# Patient Record
Sex: Female | Born: 1960
Health system: Southern US, Community
[De-identification: ages and names within clinical notes are randomized; demographics above are authoritative.]

## PROBLEM LIST (undated history)

## (undated) DIAGNOSIS — F419 Anxiety disorder, unspecified: Secondary | ICD-10-CM

## (undated) DIAGNOSIS — Z9889 Other specified postprocedural states: Secondary | ICD-10-CM

## (undated) DIAGNOSIS — K649 Unspecified hemorrhoids: Secondary | ICD-10-CM

## (undated) DIAGNOSIS — R51 Headache: Secondary | ICD-10-CM

## (undated) DIAGNOSIS — C541 Malignant neoplasm of endometrium: Secondary | ICD-10-CM

## (undated) DIAGNOSIS — R7303 Prediabetes: Secondary | ICD-10-CM

## (undated) DIAGNOSIS — I10 Essential (primary) hypertension: Secondary | ICD-10-CM

## (undated) DIAGNOSIS — R112 Nausea with vomiting, unspecified: Secondary | ICD-10-CM

## (undated) DIAGNOSIS — R519 Headache, unspecified: Secondary | ICD-10-CM

## (undated) DIAGNOSIS — T7840XA Allergy, unspecified, initial encounter: Secondary | ICD-10-CM

## (undated) DIAGNOSIS — Z87442 Personal history of urinary calculi: Secondary | ICD-10-CM

## (undated) HISTORY — DX: Unspecified hemorrhoids: K64.9

## (undated) HISTORY — PX: OTHER SURGICAL HISTORY: SHX169

## (undated) HISTORY — DX: Nausea with vomiting, unspecified: Z98.890

## (undated) HISTORY — PX: WISDOM TOOTH EXTRACTION: SHX21

## (undated) HISTORY — DX: Malignant neoplasm of endometrium: C54.1

## (undated) HISTORY — PX: APPENDECTOMY: SHX54

## (undated) HISTORY — DX: Allergy, unspecified, initial encounter: T78.40XA

## (undated) HISTORY — DX: Other specified postprocedural states: R11.2

## (undated) HISTORY — PX: COLONOSCOPY: SHX174

## (undated) HISTORY — DX: Essential (primary) hypertension: I10

---

## 2016-12-21 DIAGNOSIS — E8881 Metabolic syndrome: Secondary | ICD-10-CM | POA: Diagnosis not present

## 2016-12-21 DIAGNOSIS — E668 Other obesity: Secondary | ICD-10-CM | POA: Diagnosis not present

## 2016-12-21 DIAGNOSIS — F331 Major depressive disorder, recurrent, moderate: Secondary | ICD-10-CM | POA: Diagnosis not present

## 2016-12-21 DIAGNOSIS — I10 Essential (primary) hypertension: Secondary | ICD-10-CM | POA: Diagnosis not present

## 2017-03-22 DIAGNOSIS — I10 Essential (primary) hypertension: Secondary | ICD-10-CM | POA: Diagnosis not present

## 2017-03-22 DIAGNOSIS — E782 Mixed hyperlipidemia: Secondary | ICD-10-CM | POA: Diagnosis not present

## 2017-03-22 DIAGNOSIS — F331 Major depressive disorder, recurrent, moderate: Secondary | ICD-10-CM | POA: Diagnosis not present

## 2017-03-22 DIAGNOSIS — E8881 Metabolic syndrome: Secondary | ICD-10-CM | POA: Diagnosis not present

## 2017-03-29 DIAGNOSIS — E668 Other obesity: Secondary | ICD-10-CM | POA: Diagnosis not present

## 2017-03-29 DIAGNOSIS — I10 Essential (primary) hypertension: Secondary | ICD-10-CM | POA: Diagnosis not present

## 2017-03-29 DIAGNOSIS — F331 Major depressive disorder, recurrent, moderate: Secondary | ICD-10-CM | POA: Diagnosis not present

## 2017-03-29 DIAGNOSIS — E8881 Metabolic syndrome: Secondary | ICD-10-CM | POA: Diagnosis not present

## 2017-07-29 DIAGNOSIS — F331 Major depressive disorder, recurrent, moderate: Secondary | ICD-10-CM | POA: Diagnosis not present

## 2017-07-29 DIAGNOSIS — E668 Other obesity: Secondary | ICD-10-CM | POA: Diagnosis not present

## 2017-07-29 DIAGNOSIS — E8881 Metabolic syndrome: Secondary | ICD-10-CM | POA: Diagnosis not present

## 2017-07-29 DIAGNOSIS — I10 Essential (primary) hypertension: Secondary | ICD-10-CM | POA: Diagnosis not present

## 2017-08-04 DIAGNOSIS — H5213 Myopia, bilateral: Secondary | ICD-10-CM | POA: Diagnosis not present

## 2018-01-18 DIAGNOSIS — R7301 Impaired fasting glucose: Secondary | ICD-10-CM | POA: Diagnosis not present

## 2018-01-18 DIAGNOSIS — E8881 Metabolic syndrome: Secondary | ICD-10-CM | POA: Diagnosis not present

## 2018-01-18 DIAGNOSIS — E782 Mixed hyperlipidemia: Secondary | ICD-10-CM | POA: Diagnosis not present

## 2018-01-18 DIAGNOSIS — I10 Essential (primary) hypertension: Secondary | ICD-10-CM | POA: Diagnosis not present

## 2018-01-25 DIAGNOSIS — E8881 Metabolic syndrome: Secondary | ICD-10-CM | POA: Diagnosis not present

## 2018-01-25 DIAGNOSIS — E668 Other obesity: Secondary | ICD-10-CM | POA: Diagnosis not present

## 2018-01-25 DIAGNOSIS — I10 Essential (primary) hypertension: Secondary | ICD-10-CM | POA: Diagnosis not present

## 2018-02-06 DIAGNOSIS — Z1231 Encounter for screening mammogram for malignant neoplasm of breast: Secondary | ICD-10-CM | POA: Diagnosis not present

## 2018-02-23 DIAGNOSIS — R9389 Abnormal findings on diagnostic imaging of other specified body structures: Secondary | ICD-10-CM | POA: Diagnosis not present

## 2018-02-23 DIAGNOSIS — N939 Abnormal uterine and vaginal bleeding, unspecified: Secondary | ICD-10-CM | POA: Diagnosis not present

## 2018-03-14 ENCOUNTER — Encounter: Payer: Self-pay | Admitting: *Deleted

## 2018-03-14 DIAGNOSIS — N938 Other specified abnormal uterine and vaginal bleeding: Secondary | ICD-10-CM | POA: Diagnosis not present

## 2018-03-14 DIAGNOSIS — R9389 Abnormal findings on diagnostic imaging of other specified body structures: Secondary | ICD-10-CM | POA: Diagnosis not present

## 2018-03-14 DIAGNOSIS — N8502 Endometrial intraepithelial neoplasia [EIN]: Secondary | ICD-10-CM | POA: Diagnosis not present

## 2018-03-14 DIAGNOSIS — Z6837 Body mass index (BMI) 37.0-37.9, adult: Secondary | ICD-10-CM | POA: Diagnosis not present

## 2018-03-20 HISTORY — PX: ABDOMINAL HYSTERECTOMY: SHX81

## 2018-03-22 DIAGNOSIS — Z6837 Body mass index (BMI) 37.0-37.9, adult: Secondary | ICD-10-CM | POA: Diagnosis not present

## 2018-03-22 DIAGNOSIS — N8502 Endometrial intraepithelial neoplasia [EIN]: Secondary | ICD-10-CM | POA: Diagnosis not present

## 2018-04-05 DIAGNOSIS — N8502 Endometrial intraepithelial neoplasia [EIN]: Secondary | ICD-10-CM | POA: Diagnosis not present

## 2018-04-05 DIAGNOSIS — N840 Polyp of corpus uteri: Secondary | ICD-10-CM | POA: Diagnosis not present

## 2018-04-05 DIAGNOSIS — N800 Endometriosis of uterus: Secondary | ICD-10-CM | POA: Diagnosis not present

## 2018-04-05 DIAGNOSIS — Z91048 Other nonmedicinal substance allergy status: Secondary | ICD-10-CM | POA: Diagnosis not present

## 2018-04-05 DIAGNOSIS — C541 Malignant neoplasm of endometrium: Secondary | ICD-10-CM | POA: Diagnosis not present

## 2018-04-05 DIAGNOSIS — D62 Acute posthemorrhagic anemia: Secondary | ICD-10-CM | POA: Diagnosis not present

## 2018-04-19 ENCOUNTER — Telehealth: Payer: Self-pay | Admitting: *Deleted

## 2018-04-19 NOTE — Telephone Encounter (Signed)
Called and spoke with the patient, gave appt date/time of May 7th at 1:30pm. Gave the patient information for valet, pelvic exam and address.

## 2018-04-25 ENCOUNTER — Encounter: Payer: Self-pay | Admitting: Gynecologic Oncology

## 2018-04-25 ENCOUNTER — Encounter: Payer: Self-pay | Admitting: Obstetrics

## 2018-04-25 ENCOUNTER — Inpatient Hospital Stay: Payer: BLUE CROSS/BLUE SHIELD | Attending: Obstetrics | Admitting: Obstetrics

## 2018-04-25 VITALS — BP 142/80 | HR 96 | Temp 98.9°F | Resp 20 | Ht 65.0 in | Wt 224.6 lb

## 2018-04-25 DIAGNOSIS — C541 Malignant neoplasm of endometrium: Secondary | ICD-10-CM | POA: Diagnosis not present

## 2018-04-25 DIAGNOSIS — Z90722 Acquired absence of ovaries, bilateral: Secondary | ICD-10-CM

## 2018-04-25 DIAGNOSIS — Z90710 Acquired absence of both cervix and uterus: Secondary | ICD-10-CM | POA: Diagnosis not present

## 2018-04-25 NOTE — Patient Instructions (Signed)
1)Plan to have an ultrasound at the end of June with an appointment to meet with Dr. Gerarda Fraction early July.  2)We will also order testing on your tumor removed at the time of surgery to see if there may be a genetic component that contributed to the development of this cancer.  If this returns positive, we will refer you to genetics.   3) Please call for any questions or concerns at [phone].

## 2018-04-25 NOTE — Progress Notes (Signed)
Consult Note: Gyn-Onc  Consult was requested by Dr. Gari Crown  CC:  Chief Complaint  Patient presents with  . Endometrial adenocarcinoma [facility])    HPI: Ms. Eileen Rodriguez  is a very nice 57 y.o. P4  The patient had gone almost 1 year with no menses and then in August 2018 she had 2 months of bleeding again.  This was mentioned to her primary care provider and an ultrasound was ordered.  This revealed an endometrial thickness of 20 mm and a 10 mm polypoid lesion.  She was then referred on to Dr. Evie Lacks and on 03/14/2018 and endometrial biopsy revealed complex hyperplasia with atypia.  The patient desired to proceed with definitive hysterectomy and was taken the operating room 04/05/2018 (~ 3 weeks prior to today) where a total abdominal hysterectomy was performed at [facility] in [facility].  Unfortunately the final pathology on the uterus revealed an endometrial cancer with pathology as follows: Grade 1 Endometrioid Endometrial Adenocarcinoma No myometrial invasion was seen. No lymph-vascular invasion was seen. No cervical stromal or serosal involvement.  Her ovaries and tubes remain in-situ. We do not have a copy of a CXR yet.  She is therefore at least a Stage IA, Grade 1 EM Uterine CA.  She has no current complaints relating to the diagnosis. She wants to hold off on any further surgery for now due to the upcoming wedding of her daughter, who does not know her diagnosis.  Measurement of disease:  . TBD   Radiology: . 02/23/2018-TA/TVUS-[facility] rocking him-uterus 9.6 x 6.1 x 7 with a 20 mm endometrial stripe normal adnexa bilaterally with a left ovary noted to have 1 cm cyst likely dominant follicle. Marland Kitchen Pending follow-up on a chest x-ray    Oncologic History:     No history exists.    Current Meds:  No outpatient encounter medications on file as of 04/25/2018.   No facility-administered encounter medications on file as of 04/25/2018.     Allergy:  Allergies   Allergen Reactions  . Tape Rash    Bandaids  She has had Dermabond application without reaction  Social Hx:   Social History   Socioeconomic History  . Marital status: Married    Spouse name: Not on file  . Number of children: Not on file  . Years of education: Not on file  . Highest education level: Not on file  Occupational History  . Not on file  Social Needs  . Financial resource strain: Not on file  . Food insecurity:    Worry: Not on file    Inability: Not on file  . Transportation needs:    Medical: Not on file    Non-medical: Not on file  Tobacco Use  . Smoking status: Never Smoker  . Smokeless tobacco: Never Used  Substance and Sexual Activity  . Alcohol use: Never    Frequency: Never  . Drug use: Never  . Sexual activity: Yes  Lifestyle  . Physical activity:    Days per week: Not on file    Minutes per session: Not on file  . Stress: Not on file  Relationships  . Social connections:    Talks on phone: Not on file    Gets together: Not on file    Attends religious service: Not on file    Active member of club or organization: Not on file    Attends meetings of clubs or organizations: Not on file    Relationship status: Not on  file  . Intimate partner violence:    Fear of current or ex partner: Not on file    Emotionally abused: Not on file    Physically abused: Not on file    Forced sexual activity: Not on file  Other Topics Concern  . Not on file  Social History Narrative  . Not on file    Past Surgical Hx:  Past Surgical History:  Procedure Laterality Date  . ABDOMINAL HYSTERECTOMY  03/2018  . APPENDECTOMY     6 months pregnant  . WISDOM TOOTH EXTRACTION      Past Medical Hx:  Past Medical History:  Diagnosis Date  . Uterine polyp     Past Gynecological History:   GYNECOLOGIC HISTORY:  No LMP recorded. Menarche: 57 years old P 4 LMP 02/2018 Contraceptive OCPs less than 10 years HRT none  Last Pap March 2019  Family Hx:   Family History  Problem Relation Age of Onset  . Thyroid disease Mother 65       treated with radiation  . Breast cancer Maternal Grandmother 60       with metastases to the brain    Review of Systems:  Review of Systems  Constitutional: Negative.   HENT:  Negative.   Eyes: Negative.   Respiratory: Negative.   Cardiovascular: Negative.   Gastrointestinal: Negative.   Endocrine: Negative.   Genitourinary: Negative.    Musculoskeletal: Negative.   Skin: Negative.   Neurological: Negative.   Hematological: Negative.   Psychiatric/Behavioral: Negative.     Vitals:  Blood pressure (!) 142/80, pulse 96, temperature 98.9 F (37.2 C), temperature source Oral, resp. rate 20, height '5\' 5"'$  (1.651 m), weight 224 lb 9.6 oz (101.9 kg), SpO2 100 %. Body mass index is 37.38 kg/m.   Physical Exam: ECOG PERFORMANCE STATUS: 0 - Asymptomatic   General :  Well developed, 57 y.o., female in no apparent distress HEENT:  Normocephalic/atraumatic, symmetric, EOMI, eyelids normal Neck:   Supple, no masses.  Lymphatics:  No cervical/ submandibular/ supraclavicular/ infraclavicular/ inguinal adenopathy Respiratory:  Respirations unlabored, no use of accessory muscles CV:   Deferred Breast:  Deferred Musculoskeletal: No CVA tenderness, normal muscle strength. Abdomen:  Transverse incision is healing, abdomen is soft, non-tender and nondistended. No evidence of hernia. No masses. Extremities:  No lymphedema, no erythema, non-tender. Skin:   Normal inspection Neuro/Psych:  No focal motor deficit, no abnormal mental status. Normal gait. Normal affect. Alert and oriented to person, place, and time  Genito Urinary: Deferred given recent surgery  Oncologic Summary: 1. At least Stage IA, Grade 1 endometrioid EM CA  Assessment/Plan: 1. Counseling for uterine cancer ? We discussed the diagnosis of uterine cancer and the typical presentation and prognosis ? We reviewed standard of care being  hysterectomy with BSO  ? Regarding lymphadenectomy; given this is not invading the myometrium and there is no LVSI along with a grade 1 tumor the risk of involvement of the lymph nodes is quite low and so I do not think proceeding with lymphadenectomy is necessary 2. Surgical management of the adnexa ? We did review again that standard of care surgical management of uterine cancer would include removal of the adnexa however since her preoperative diagnosis was not known to be malignant she has the ovaries in situ. ? There is some data on premenopausal women and conservation of the adnexa however I do not know of any strong data looking in the postmenopausal setting ? Likely the risk of finding malignancy in  the ovaries is quite low given the early stage of the disease however my recommendation is to consider return to surgery to perform a BSO. ? The patient's daughter has a wedding coming up in June and the patient would like to wait until after that time to proceed with any additional treatments  3. Pending items  ? Obtaining a chest x-ray ? MMR on outside hysterectomy specimen to see if risk of Lynch ? TVUS to evaluate adnexae once vaginal cuff healed. (about 6 weeks postop) ? Should she wish to conserve her ovaries I would recommend every 6 to 28-monthtransvaginal ultrasound along with her routine clinical exams 4. Her husband and her sister were present with the patient for the discussion with no further questions after review of the above 5. Plan to have her return after her transvaginal ultrasound to re-review the recommendation for BSO. 6. She will need a pelvic on return as she was only 3 weeks postop today and I did not want to disrupt the vaginal cuff healing.    SIsabel Caprice MD  04/26/2018, 3:39 PM  Cc: NGari Crown MD (Referring OB/GYN) PCP not to be copied until further notice.

## 2018-04-25 NOTE — Progress Notes (Signed)
MMR ordered on path from [facility], [id number], also known as [phone] case with [facility].  Spoke with Maudie Mercury at [employer].

## 2018-04-26 ENCOUNTER — Encounter: Payer: Self-pay | Admitting: Obstetrics

## 2018-04-26 DIAGNOSIS — C541 Malignant neoplasm of endometrium: Secondary | ICD-10-CM | POA: Insufficient documentation

## 2018-05-01 ENCOUNTER — Telehealth: Payer: Self-pay | Admitting: Gynecologic Oncology

## 2018-05-01 ENCOUNTER — Telehealth: Payer: Self-pay | Admitting: *Deleted

## 2018-05-01 DIAGNOSIS — C541 Malignant neoplasm of endometrium: Secondary | ICD-10-CM

## 2018-05-01 NOTE — Telephone Encounter (Signed)
Returned call to patient.  Patient stating she is calling to follow up on percentages that Dr. Gerarda Fraction was obtaining for her. Also advised we need to schedule for her to have a chest xray.  Patient advised that she would be contacted with information about setting up her xray.  No other concerns voiced.  Patient stating the information can be left on voicemail.

## 2018-05-01 NOTE — Telephone Encounter (Signed)
Called and spoke with the patient regarding her chest xray. Explained to the patient that at she can go to [facility] to the radiology department as a walk in appt for the xray

## 2018-05-24 ENCOUNTER — Ambulatory Visit (HOSPITAL_COMMUNITY)
Admission: RE | Admit: 2018-05-24 | Discharge: 2018-05-24 | Disposition: A | Discharge status: Home / Self Care | Payer: BLUE CROSS/BLUE SHIELD | Source: Ambulatory Visit | Attending: Gynecologic Oncology | Admitting: Gynecologic Oncology

## 2018-05-24 ENCOUNTER — Encounter (HOSPITAL_COMMUNITY): Payer: Self-pay | Admitting: Radiology

## 2018-05-24 DIAGNOSIS — C541 Malignant neoplasm of endometrium: Secondary | ICD-10-CM | POA: Insufficient documentation

## 2018-05-24 DIAGNOSIS — Z8542 Personal history of malignant neoplasm of other parts of uterus: Secondary | ICD-10-CM | POA: Diagnosis not present

## 2018-05-25 ENCOUNTER — Telehealth: Payer: Self-pay | Admitting: Gynecologic Oncology

## 2018-05-25 NOTE — Telephone Encounter (Signed)
Patient informed of chest xray results.  No concerns voiced.  Advised to call for any needs or concerns.

## 2018-06-13 ENCOUNTER — Ambulatory Visit (HOSPITAL_COMMUNITY)
Admission: RE | Admit: 2018-06-13 | Discharge: 2018-06-13 | Disposition: A | Discharge status: Home / Self Care | Payer: BLUE CROSS/BLUE SHIELD | Source: Ambulatory Visit | Attending: Obstetrics | Admitting: Obstetrics

## 2018-06-13 DIAGNOSIS — N838 Other noninflammatory disorders of ovary, fallopian tube and broad ligament: Secondary | ICD-10-CM | POA: Diagnosis not present

## 2018-06-13 DIAGNOSIS — N83202 Unspecified ovarian cyst, left side: Secondary | ICD-10-CM | POA: Diagnosis not present

## 2018-06-13 DIAGNOSIS — C541 Malignant neoplasm of endometrium: Secondary | ICD-10-CM

## 2018-06-15 ENCOUNTER — Telehealth: Payer: Self-pay

## 2018-06-15 NOTE — Progress Notes (Signed)
Progress Note : GYN-ONC Established Patient FOLLOW-UP  Consult was originally requested by Dr. Gari Crown  CC:  Chief Complaint  Patient presents with  . Endometrial adenocarcinoma [facility])    HPI: Ms. Eileen Rodriguez  is a very nice 57 y.o. P4  . Interval History Last seen 04/25/2016   Recall she wanted to hold off on any further surgery due to the wedding of her daughter. She is now ready to proceed with surgery.   Since her last visit we obtained a TVUS. Results are below, notable for a probably paratubal cyst. Also CXR 05/24/2018 was NED.  She presents today to discuss proceeding with BSO and pelvic (not done on the first visit).   . Presenting / Chronicle of Oncologic History The patient had gone almost 1 year with no menses and then in August 2018 she had 2 months of bleeding again.  This was mentioned to her primary care provider and an ultrasound was ordered.  This revealed an endometrial thickness of 20 mm and a 10 mm polypoid lesion.  She was then referred on to Dr. Evie Lacks and on 03/14/2018 and endometrial biopsy revealed complex hyperplasia with atypia.  The patient desired to proceed with definitive hysterectomy and was taken the operating room 04/05/2018 (~ 3 weeks prior to today) where a total abdominal hysterectomy was performed at [facility] in [place], [place].  Unfortunately the final pathology on the uterus revealed an endometrial cancer with pathology as follows: Grade 1 Endometrioid Endometrial Adenocarcinoma No myometrial invasion was seen. No lymph-vascular invasion was seen. No cervical stromal or serosal involvement.  Her ovaries and tubes remain in-situ.   She is therefore at least a Stage IA, Grade 1 EM Uterine CA.  OK with Dermabond  Measurement of disease:  . TBD   Radiology: . 02/23/2018-TA/TVUS-[facility] rocking him-uterus 9.6 x 6.1 x 7 with a 20 mm endometrial stripe normal adnexa bilaterally with a left ovary noted to have 1 cm cyst likely dominant  follicle. . 05/24/2018 CXR - NED . 06/13/18 - TVUS - Uterus Surgically absent Right ovary Measurements: 2.6 x 1.5 x 2.0 cm. Normal appearance/no adnexal mass. Left ovary Measurements: 3.0 x 1.9 x 2.4 cm. Within the left adnexa there is a 4.0 x 3.7 x 1.1 cm tubular shaped cystic structure Other findings None IMPRESSION: Tubular shaped cystic structure measuring up to 4 cm within the left adnexa which is nonspecific in etiology and may represent postoperative seroma or potentially hydrosalpinx. Recommend follow-up pelvic ultrasound in 2-3 months to assess for interval change. Electronically Signed   By: Lovey Newcomer M.D.    EPIC Oncologic History:   No history exists.    Current Meds:  No outpatient encounter medications on file as of 06/19/2018.   No facility-administered encounter medications on file as of 06/19/2018.     Allergy:  Allergies  Allergen Reactions  . Tape Rash    Bandaids  She has had Dermabond application without reaction  Social Hx:   Tobacco use: none EtOH use: none Illicit Drug use: none IV Drug use: not asked  Past Surgical Hx:  Past Surgical History:  Procedure Laterality Date  . ABDOMINAL HYSTERECTOMY  03/2018  . APPENDECTOMY     6 months pregnant  . WISDOM TOOTH EXTRACTION      Past Medical Hx:  Past Medical History:  Diagnosis Date  . Uterine polyp     Past Gynecological History:   GYNECOLOGIC HISTORY:  No LMP recorded. Patient has had a hysterectomy. Menarche:  57 years old P 4 LMP 02/2018 Contraceptive OCPs less than 10 years HRT none  Last Pap March 2019  Family Hx:  Family History  Problem Relation Age of Onset  . Thyroid disease Mother 7       treated with radiation  . Breast cancer Maternal Grandmother 60       with metastases to the brain    Review of Systems:  Review of Systems  Constitutional: Negative.   HENT:  Negative.   Eyes: Negative.   Respiratory: Negative.   Cardiovascular: Negative.   Gastrointestinal: Negative.    Endocrine: Negative.   Genitourinary: Negative.    Musculoskeletal: Negative.   Skin: Negative.   Neurological: Negative.   Hematological: Negative.   Psychiatric/Behavioral: Negative.     Vitals:  Blood pressure (!) 149/84, pulse 84, temperature 98.5 F (36.9 C), temperature source Oral, resp. rate 20, height '5\' 5"'$  (1.651 m), weight 227 lb 9.6 oz (103.2 kg), SpO2 100 %. Body mass index is 37.87 kg/m.   Physical Exam: ECOG PERFORMANCE STATUS: 0 - Asymptomatic   General :  Overweight, well developed, 57 y.o., female in no apparent distress HEENT:  Normocephalic/atraumatic, symmetric, EOMI, eyelids normal Neck:   Supple, no masses.  Lymphatics:  No cervical/ submandibular/ supraclavicular/ infraclavicular/ inguinal adenopathy Respiratory:  Respirations unlabored, no use of accessory muscles CV:   Deferred Breast:  Deferred Musculoskeletal: No CVA tenderness, normal muscle strength. Abdomen:  Midline vertical incision, healed. Soft, non-tender and nondistended. No evidence of hernia. No masses. Extremities:  No lymphedema, no erythema, non-tender. Skin:   Normal inspection Neuro/Psych:  No focal motor deficit, no abnormal mental status. Normal gait. Normal affect. Alert and oriented to person, place, and time  Genito Urinary: Vulva: Normal external female genitalia.  Bladder/urethra: Urethral meatus normal in size and location. No lesions or   masses, well supported bladder Speculum exam: Vagina: No lesion, no discharge, no bleeding. Bimanual exam: Cervix/Uterus: Surgically absent  Adnexa: No masses. Rectovaginal:  Good tone, no masses, no cul de sac nodularity, no parametrial involvement or nodularity.    Oncologic Summary: 1. At least Stage IA, Grade 1 endometrioid EM CA  Assessment/Plan: 1. Previous counseling for uterine cancer ? We discussed the diagnosis of uterine cancer and the typical presentation and prognosis ? We reviewed standard of care being hysterectomy  with BSO  ? Regarding lymphadenectomy; given this is not invading the myometrium and there is no LVSI along with a grade 1 tumor the risk of involvement of the lymph nodes is quite low and so I do not think proceeding with lymphadenectomy is necessary 2. Surgical management of the adnexa ? TVUS shows a cyst.  ? I recommend proceeding with BSO ? We talked about the surgery today and hopes this will be laparoscopic. Frozen section will be sent. She knows there is an increased risk of laparotomy given the prior abdominal surgeries (TAH and Open Appy) ? Surgical sketch was reviewed and she is to be given a copy. 3. Pending items  ? MMR on outside hysterectomy specimen to see if risk of Lynch 4. Her husband was present with the patient for the discussion with no further questions after review of the above   Isabel Caprice, MD  06/19/2018, 11:01 AM  Cc: Gari Crown, MD (Referring OB/GYN)

## 2018-06-15 NOTE — Telephone Encounter (Signed)
Per Barbaraann Share RN, Dr Gerarda Fraction would like nurse to call pt, let pt know per Dr Gerarda Fraction:  "Let her know the ultrasound shows a cystic area, I think on her fallopian tube.  It is not concerning that I think we need to move forward with surgery yet, but if she plans to wait beyond 3 months, to have the ovaries out, I should order another scan in a few weeks."  Attempted pt's number, no answer, left her a VM with our contact info to return our call.   Per LouiseRN - there is an appt for 06/19/2018 at 9:30 am with Dr Gerarda Fraction for patient and also let pt know she can discuss options above with Dr Gerarda Fraction at the appt- when pt returns call.

## 2018-06-16 NOTE — Telephone Encounter (Signed)
Discussed [place] results with patient as noted below by Dr. Gerarda Fraction.  Pt will come to appointment on 06-19-18 as scheduled.

## 2018-06-19 ENCOUNTER — Inpatient Hospital Stay: Payer: BLUE CROSS/BLUE SHIELD | Attending: Obstetrics | Admitting: Obstetrics

## 2018-06-19 ENCOUNTER — Encounter: Payer: Self-pay | Admitting: Obstetrics

## 2018-06-19 VITALS — BP 149/84 | HR 84 | Temp 98.5°F | Resp 20 | Ht 65.0 in | Wt 227.6 lb

## 2018-06-19 DIAGNOSIS — Z90710 Acquired absence of both cervix and uterus: Secondary | ICD-10-CM | POA: Diagnosis not present

## 2018-06-19 DIAGNOSIS — C541 Malignant neoplasm of endometrium: Secondary | ICD-10-CM | POA: Diagnosis not present

## 2018-06-19 NOTE — Patient Instructions (Signed)
Preparing for your Surgery  Plan for surgery on August 01, 2018 with Dr. Precious Haws at [place] will be scheduled for a robotic assisted laparoscopic bilateral salpingo-oophorectomy, pelvic washings, possible laparotomy.  Pre-operative Testing -You will receive a phone call from presurgical testing at [facility] to arrange for a pre-operative testing appointment before your surgery.  This appointment normally occurs one to two weeks before your scheduled surgery.   -Bring your insurance card, copy of an advanced directive if applicable, medication list  -At that visit, you will be asked to sign a consent for a possible blood transfusion in case a transfusion becomes necessary during surgery.  The need for a blood transfusion is rare but having consent is a necessary part of your care.    -You should not be taking blood thinners or aspirin at least ten days prior to surgery unless instructed by your surgeon.  Day Before Surgery at [place] will be asked to take in a light diet the day before surgery.  Avoid carbonated beverages.  You will be advised to have nothing to eat or drink after midnight the evening before.    Eat a light diet the day before surgery.  Examples including soups, broths, toast, yogurt, mashed potatoes.  Things to avoid include carbonated beverages (fizzy beverages), raw fruits and raw vegetables, or beans.   If your bowels are filled with gas, your surgeon will have difficulty visualizing your pelvic organs which increases your surgical risks.  Your role in recovery Your role is to become active as soon as directed by your doctor, while still giving yourself time to heal.  Rest when you feel tired. You will be asked to do the following in order to speed your recovery:  - Cough and breathe deeply. This helps toclear and expand your lungs and can prevent pneumonia. You may be given a spirometer to practice deep breathing. A staff  member will show you how to use the spirometer. - Do mild physical activity. Walking or moving your legs help your circulation and body functions return to normal. A staff member will help you when you try to walk and will provide you with simple exercises. Do not try to get up or walk alone the first time. - Actively manage your pain. Managing your pain lets you move in comfort. We will ask you to rate your pain on a scale of zero to 10. It is your responsibility to tell your doctor or nurse where and how much you hurt so your pain can be treated.  Special Considerations -If you are diabetic, you may be placed on insulin after surgery to have closer control over your blood sugars to promote healing and recovery.  This does not mean that you will be discharged on insulin.  If applicable, your oral antidiabetics will be resumed when you are tolerating a solid diet.  -Your final pathology results from surgery should be available by the Friday after surgery and the results will be relayed to you when available.  -Dr. Lahoma Crocker is the Surgeon that assists your GYN Oncologist with surgery.  The next day after your surgery you will either see your GYN Oncologist or Dr. Lahoma Crocker.   Blood Transfusion Information WHAT IS A BLOOD TRANSFUSION? A transfusion is the replacement of blood or some of its parts. Blood is made up of multiple cells which provide different functions.  Red blood cells carry oxygen and are used for blood loss replacement.  White blood cells fight against infection.  Platelets control bleeding.  Plasma helps clot blood.  Other blood products are available for specialized needs, such as hemophilia or other clotting disorders. BEFORE THE TRANSFUSION  Who gives blood for transfusions?   You may be able to donate blood to be used at a later date on yourself (autologous donation).  Relatives can be asked to donate blood. This is generally not any safer than if  you have received blood from a stranger. The same precautions are taken to ensure safety when a relative's blood is donated.  Healthy volunteers who are fully evaluated to make sure their blood is safe. This is blood bank blood. Transfusion therapy is the safest it has ever been in the practice of medicine. Before blood is taken from a donor, a complete history is taken to make sure that person has no history of diseases nor engages in risky social behavior (examples are intravenous drug use or sexual activity with multiple partners). The donor's travel history is screened to minimize risk of transmitting infections, such as malaria. The donated blood is tested for signs of infectious diseases, such as HIV and hepatitis. The blood is then tested to be sure it is compatible with you in order to minimize the chance of a transfusion reaction. If you or a relative donates blood, this is often done in anticipation of surgery and is not appropriate for emergency situations. It takes many days to process the donated blood. RISKS AND COMPLICATIONS Although transfusion therapy is very safe and saves many lives, the main dangers of transfusion include:   Getting an infectious disease.  Developing a transfusion reaction. This is an allergic reaction to something in the blood you were given. Every precaution is taken to prevent this. The decision to have a blood transfusion has been considered carefully by your caregiver before blood is given. Blood is not given unless the benefits outweigh the risks.

## 2018-07-21 NOTE — Patient Instructions (Addendum)
Eileen Rodriguez  07/21/2018   Your procedure is scheduled on: 08-01-18  Report to [facility] Main  Entrance  Report to admitting at     0530  AM    Call this number if you have problems the morning of surgery [phone]                Follow a light diet the day before your surgery examples are toast, mashed potatoes, yogurt, soups and broths, avoid: carbonated beverages raw fruits and veggies and beans   Remember:    NO SOLID FOOD AFTER MIDNIGHT THE NIGHT PRIOR TO SURGERY. NOTHING BY MOUTH EXCEPT CLEAR LIQUIDS UNTIL 3 HOURS PRIOR TO [place] SURGERY. PLEASE FINISH ENSURE DRINK PER SURGEON ORDER 3 HOURS PRIOR TO SCHEDULED SURGERY TIME WHICH NEEDS TO BE COMPLETED AT __0430 am__________.   Take these medicines the morning of surgery with A SIP OF WATER: NONE                                 You may not have any metal on your body including hair pins and              piercings  Do not wear jewelry, make-up, lotions, powders or perfumes, deodorant             Do not wear nail polish.  Do not shave  48 hours prior to surgery.            Do not bring valuables to the hospital. [place].  Contacts, dentures or bridgework may not be worn into surgery.  Leave suitcase in the car. After surgery it may be brought to your room.     Patients discharged the day of surgery will not be allowed to drive home.  Name and phone number of your driver:  Special Instructions: N/A              Please read over the following fact sheets you were given: _____________________________________________________________________           [facility] - Preparing for Surgery Before surgery, you can play an important role.  Because skin is not sterile, your skin needs to be as free of germs as possible.  You can reduce the number of germs on your skin by washing with CHG (chlorahexidine gluconate) soap before surgery.  CHG is an antiseptic  cleaner which kills germs and bonds with the skin to continue killing germs even after washing. Please DO NOT use if you have an allergy to CHG or antibacterial soaps.  If your skin becomes reddened/irritated stop using the CHG and inform your nurse when you arrive at Short Stay. Do not shave (including legs and underarms) for at least 48 hours prior to the first CHG shower.  You may shave your face/neck. Please follow these instructions carefully:  1.  Shower with CHG Soap the night before surgery and the  morning of Surgery.  2.  If you choose to wash your hair, wash your hair first as usual with your  normal  shampoo.  3.  After you shampoo, rinse your hair and body thoroughly to remove the  shampoo.  4.  Use CHG as you would any other liquid soap.  You can apply chg directly  to the skin and wash                       Gently with a scrungie or clean washcloth.  5.  Apply the CHG Soap to your body ONLY FROM THE NECK DOWN.   Do not use on face/ open                           Wound or open sores. Avoid contact with eyes, ears mouth and genitals (private parts).                       Wash face,  Genitals (private parts) with your normal soap.             6.  Wash thoroughly, paying special attention to the area where your surgery  will be performed.  7.  Thoroughly rinse your body with warm water from the neck down.  8.  DO NOT shower/wash with your normal soap after using and rinsing off  the CHG Soap.                9.  Pat yourself dry with a clean towel.            10.  Wear clean pajamas.            11.  Place clean sheets on your bed the night of your first shower and do not  sleep with pets. Day of Surgery : Do not apply any lotions/deodorants the morning of surgery.  Please wear clean clothes to the hospital/surgery center.  FAILURE TO FOLLOW THESE INSTRUCTIONS MAY RESULT IN THE CANCELLATION OF YOUR SURGERY PATIENT  SIGNATURE_________________________________  NURSE SIGNATURE__________________________________  ________________________________________________________________________  WHAT IS A BLOOD TRANSFUSION? Blood Transfusion Information  A transfusion is the replacement of blood or some of its parts. Blood is made up of multiple cells which provide different functions.  Red blood cells carry oxygen and are used for blood loss replacement.  White blood cells fight against infection.  Platelets control bleeding.  Plasma helps clot blood.  Other blood products are available for specialized needs, such as hemophilia or other clotting disorders. BEFORE THE TRANSFUSION  Who gives blood for transfusions?   Healthy volunteers who are fully evaluated to make sure their blood is safe. This is blood bank blood. Transfusion therapy is the safest it has ever been in the practice of medicine. Before blood is taken from a donor, a complete history is taken to make sure that person has no history of diseases nor engages in risky social behavior (examples are intravenous drug use or sexual activity with multiple partners). The donor's travel history is screened to minimize risk of transmitting infections, such as malaria. The donated blood is tested for signs of infectious diseases, such as HIV and hepatitis. The blood is then tested to be sure it is compatible with you in order to minimize the chance of a transfusion reaction. If you or a relative donates blood, this is often done in anticipation of surgery and is not appropriate for emergency situations. It takes many days to process the donated blood. RISKS AND COMPLICATIONS Although transfusion therapy is very safe and saves many lives, the main dangers of transfusion include:   Getting an infectious disease.  Developing a transfusion reaction. This  is an allergic reaction to something in the blood you were given. Every precaution is taken to prevent  this. The decision to have a blood transfusion has been considered carefully by your caregiver before blood is given. Blood is not given unless the benefits outweigh the risks. AFTER THE TRANSFUSION  Right after receiving a blood transfusion, you will usually feel much better and more energetic. This is especially true if your red blood cells have gotten low (anemic). The transfusion raises the level of the red blood cells which carry oxygen, and this usually causes an energy increase.  The nurse administering the transfusion will monitor you carefully for complications. HOME CARE INSTRUCTIONS  No special instructions are needed after a transfusion. You may find your energy is better. Speak with your caregiver about any limitations on activity for underlying diseases you may have. SEEK MEDICAL CARE IF:   Your condition is not improving after your transfusion.  You develop redness or irritation at the intravenous (IV) site. SEEK IMMEDIATE MEDICAL CARE IF:  Any of the following symptoms occur over the next 12 hours:  Shaking chills.  You have a temperature by mouth above 102 F (38.9 C), not controlled by medicine.  Chest, back, or muscle pain.  People around you feel you are not acting correctly or are confused.  Shortness of breath or difficulty breathing.  Dizziness and fainting.  You get a rash or develop hives.  You have a decrease in urine output.  Your urine turns a dark color or changes to pink, red, or brown. Any of the following symptoms occur over the next 10 days:  You have a temperature by mouth above 102 F (38.9 C), not controlled by medicine.  Shortness of breath.  Weakness after normal activity.  The white part of the eye turns yellow (jaundice).  You have a decrease in the amount of urine or are urinating less often.  Your urine turns a dark color or changes to pink, red, or brown. Document Released: 12/03/2000 Document Revised: 02/28/2012 Document  Reviewed: 07/22/2008 ExitCare Patient Information 2014 [place].  _______________________________________________________________________  Incentive Spirometer  An incentive spirometer is a tool that can help keep your lungs clear and active. This tool measures how well you are filling your lungs with each breath. Taking long deep breaths may help reverse or decrease the chance of developing breathing (pulmonary) problems (especially infection) following:  A long period of time when you are unable to move or be active. BEFORE THE PROCEDURE   If the spirometer includes an indicator to show your best effort, your nurse or respiratory therapist will set it to a desired goal.  If possible, sit up straight or lean slightly forward. Try not to slouch.  Hold the incentive spirometer in an upright position. INSTRUCTIONS FOR USE  1. Sit on the edge of your bed if possible, or sit up as far as you can in bed or on a chair. 2. Hold the incentive spirometer in an upright position. 3. Breathe out normally. 4. Place the mouthpiece in your mouth and seal your lips tightly around it. 5. Breathe in slowly and as deeply as possible, raising the piston or the ball toward the top of the column. 6. Hold your breath for 3-5 seconds or for as long as possible. Allow the piston or ball to fall to the bottom of the column. 7. Remove the mouthpiece from your mouth and breathe out normally. 8. Rest for a few seconds and repeat Steps 1  through 7 at least 10 times every 1-2 hours when you are awake. Take your time and take a few normal breaths between deep breaths. 9. The spirometer may include an indicator to show your best effort. Use the indicator as a goal to work toward during each repetition. 10. After each set of 10 deep breaths, practice coughing to be sure your lungs are clear. If you have an incision (the cut made at the time of surgery), support your incision when coughing by placing a pillow or  rolled up towels firmly against it. Once you are able to get out of bed, walk around indoors and cough well. You may stop using the incentive spirometer when instructed by your caregiver.  RISKS AND COMPLICATIONS  Take your time so you do not get dizzy or light-headed.  If you are in pain, you may need to take or ask for pain medication before doing incentive spirometry. It is harder to take a deep breath if you are having pain. AFTER USE  Rest and breathe slowly and easily.  It can be helpful to keep track of a log of your progress. Your caregiver can provide you with a simple table to help with this. If you are using the spirometer at home, follow these instructions: [place] IF:   You are having difficultly using the spirometer.  You have trouble using the spirometer as often as instructed.  Your pain medication is not giving enough relief while using the spirometer.  You develop fever of 100.5 F (38.1 C) or higher. SEEK IMMEDIATE MEDICAL CARE IF:   You cough up bloody sputum that had not been present before.  You develop fever of 102 F (38.9 C) or greater.  You develop worsening pain at or near the incision site. MAKE SURE YOU:   Understand these instructions.  Will watch your condition.  Will get help right away if you are not doing well or get worse. Document Released: 04/18/2007 Document Revised: 02/28/2012 Document Reviewed: 06/19/2007 [facility] Patient Information 2014 [place], [place].   ________________________________________________________________________

## 2018-07-24 DIAGNOSIS — E782 Mixed hyperlipidemia: Secondary | ICD-10-CM | POA: Diagnosis not present

## 2018-07-24 DIAGNOSIS — I10 Essential (primary) hypertension: Secondary | ICD-10-CM | POA: Diagnosis not present

## 2018-07-24 DIAGNOSIS — R7301 Impaired fasting glucose: Secondary | ICD-10-CM | POA: Diagnosis not present

## 2018-07-26 ENCOUNTER — Other Ambulatory Visit: Payer: Self-pay

## 2018-07-26 ENCOUNTER — Encounter (HOSPITAL_COMMUNITY)
Admission: RE | Admit: 2018-07-26 | Discharge: 2018-07-26 | Disposition: A | Discharge status: Home / Self Care | Payer: BLUE CROSS/BLUE SHIELD | Source: Ambulatory Visit | Attending: Obstetrics | Admitting: Obstetrics

## 2018-07-26 ENCOUNTER — Encounter (HOSPITAL_COMMUNITY): Payer: Self-pay

## 2018-07-26 DIAGNOSIS — Z01812 Encounter for preprocedural laboratory examination: Secondary | ICD-10-CM | POA: Diagnosis not present

## 2018-07-26 HISTORY — DX: Anxiety disorder, unspecified: F41.9

## 2018-07-26 HISTORY — DX: Headache, unspecified: R51.9

## 2018-07-26 HISTORY — DX: Prediabetes: R73.03

## 2018-07-26 HISTORY — DX: Personal history of urinary calculi: Z87.442

## 2018-07-26 HISTORY — DX: Headache: R51

## 2018-07-26 LAB — CBC
HEMATOCRIT: 43.7 % (ref 36.0–46.0)
Hemoglobin: 14.1 g/dL (ref 12.0–15.0)
MCH: 28.1 pg (ref 26.0–34.0)
MCHC: 32.3 g/dL (ref 30.0–36.0)
MCV: 87.2 fL (ref 78.0–100.0)
PLATELETS: 223 10*3/uL (ref 150–400)
RBC: 5.01 MIL/uL (ref 3.87–5.11)
RDW: 13.9 % (ref 11.5–15.5)
WBC: 7.3 10*3/uL (ref 4.0–10.5)

## 2018-07-26 LAB — COMPREHENSIVE METABOLIC PANEL
ALK PHOS: 83 U/L (ref 38–126)
ALT: 16 U/L (ref 0–44)
AST: 17 U/L (ref 15–41)
Albumin: 4.2 g/dL (ref 3.5–5.0)
Anion gap: 9 (ref 5–15)
BILIRUBIN TOTAL: 0.6 mg/dL (ref 0.3–1.2)
BUN: 13 mg/dL (ref 6–20)
CALCIUM: 9.4 mg/dL (ref 8.9–10.3)
CHLORIDE: 103 mmol/L (ref 98–111)
CO2: 28 mmol/L (ref 22–32)
Creatinine, Ser: 0.71 mg/dL (ref 0.44–1.00)
GFR calc Af Amer: >60 mL/min (ref >60)
Glucose, Bld: 110 mg/dL — ABNORMAL HIGH (ref 70–99)
Potassium: 3.8 mmol/L (ref 3.5–5.1)
Sodium: 140 mmol/L (ref 135–145)
Total Protein: 7.7 g/dL (ref 6.5–8.1)

## 2018-07-26 LAB — URINALYSIS, ROUTINE W REFLEX MICROSCOPIC
Bilirubin Urine: NEGATIVE
GLUCOSE, UA: NEGATIVE mg/dL
Hgb urine dipstick: NEGATIVE
KETONES UR: NEGATIVE mg/dL
Nitrite: NEGATIVE
PH: 8 (ref 5.0–8.0)
Protein, ur: NEGATIVE mg/dL
Specific Gravity, Urine: 1.003 — ABNORMAL LOW (ref 1.005–1.030)

## 2018-07-26 LAB — ABO/RH: ABO/RH(D): O POS

## 2018-07-27 DIAGNOSIS — E8881 Metabolic syndrome: Secondary | ICD-10-CM | POA: Diagnosis not present

## 2018-07-27 DIAGNOSIS — E782 Mixed hyperlipidemia: Secondary | ICD-10-CM | POA: Diagnosis not present

## 2018-07-27 DIAGNOSIS — E668 Other obesity: Secondary | ICD-10-CM | POA: Diagnosis not present

## 2018-07-27 DIAGNOSIS — I10 Essential (primary) hypertension: Secondary | ICD-10-CM | POA: Diagnosis not present

## 2018-07-31 NOTE — Anesthesia Preprocedure Evaluation (Addendum)
Anesthesia Evaluation  Patient identified by MRN, date of birth, ID band Patient awake    Reviewed: Allergy & Precautions, NPO status , Patient's Chart, lab work & pertinent test results  Airway Mallampati: II  TM Distance: >3 FB Neck ROM: Full    Dental   Pulmonary neg pulmonary ROS,    Pulmonary exam normal breath sounds clear to auscultation       Cardiovascular negative cardio ROS Normal cardiovascular exam Rhythm:Regular Rate:Normal     Neuro/Psych  Headaches, PSYCHIATRIC DISORDERS Anxiety    GI/Hepatic negative GI ROS, Neg liver ROS,   Endo/Other  negative endocrine ROS  Renal/GU negative Renal ROS     Musculoskeletal negative musculoskeletal ROS (+)   Abdominal (+) + obese,   Peds  Hematology negative hematology ROS (+)   Anesthesia Other Findings   Reproductive/Obstetrics negative OB ROS                            Anesthesia Physical Anesthesia Plan  ASA: II  Anesthesia Plan: General   Post-op Pain Management:    Induction: Intravenous  PONV Risk Score and Plan: 4 or greater and Ondansetron, Dexamethasone, Midazolam and Scopolamine patch - Pre-op  Airway Management Planned: Oral ETT  Additional Equipment:   Intra-op Plan:   Post-operative Plan: Extubation in OR  Informed Consent: I have reviewed the patients History and Physical, chart, labs and discussed the procedure including the risks, benefits and alternatives for the proposed anesthesia with the patient or authorized representative who has indicated his/her understanding and acceptance.   Dental advisory given  Plan Discussed with: CRNA  Anesthesia Plan Comments:         Anesthesia Quick Evaluation

## 2018-08-01 ENCOUNTER — Ambulatory Visit (HOSPITAL_COMMUNITY)
Admission: RE | Admit: 2018-08-01 | Discharge: 2018-08-01 | Disposition: A | Discharge status: Home / Self Care | Payer: BLUE CROSS/BLUE SHIELD | Source: Ambulatory Visit | Attending: Obstetrics | Admitting: Obstetrics

## 2018-08-01 ENCOUNTER — Encounter (HOSPITAL_COMMUNITY)
Admission: RE | Disposition: A | Discharge status: Home / Self Care | Payer: Self-pay | Source: Ambulatory Visit | Attending: Obstetrics

## 2018-08-01 ENCOUNTER — Encounter (HOSPITAL_COMMUNITY): Payer: Self-pay | Admitting: Certified Registered Nurse Anesthetist

## 2018-08-01 ENCOUNTER — Ambulatory Visit (HOSPITAL_COMMUNITY): Payer: BLUE CROSS/BLUE SHIELD | Admitting: Anesthesiology

## 2018-08-01 DIAGNOSIS — Z91048 Other nonmedicinal substance allergy status: Secondary | ICD-10-CM | POA: Diagnosis not present

## 2018-08-01 DIAGNOSIS — R8569 Abnormal cytological findings in specimens from other digestive organs and abdominal cavity: Secondary | ICD-10-CM | POA: Diagnosis not present

## 2018-08-01 DIAGNOSIS — C541 Malignant neoplasm of endometrium: Secondary | ICD-10-CM | POA: Diagnosis not present

## 2018-08-01 DIAGNOSIS — D0739 Carcinoma in situ of other female genital organs: Secondary | ICD-10-CM | POA: Diagnosis not present

## 2018-08-01 DIAGNOSIS — Z6837 Body mass index (BMI) 37.0-37.9, adult: Secondary | ICD-10-CM | POA: Diagnosis not present

## 2018-08-01 DIAGNOSIS — Z8349 Family history of other endocrine, nutritional and metabolic diseases: Secondary | ICD-10-CM | POA: Diagnosis not present

## 2018-08-01 DIAGNOSIS — Z8541 Personal history of malignant neoplasm of cervix uteri: Secondary | ICD-10-CM | POA: Diagnosis not present

## 2018-08-01 DIAGNOSIS — F419 Anxiety disorder, unspecified: Secondary | ICD-10-CM | POA: Insufficient documentation

## 2018-08-01 DIAGNOSIS — Z8542 Personal history of malignant neoplasm of other parts of uterus: Secondary | ICD-10-CM | POA: Diagnosis not present

## 2018-08-01 DIAGNOSIS — Z90710 Acquired absence of both cervix and uterus: Secondary | ICD-10-CM | POA: Insufficient documentation

## 2018-08-01 DIAGNOSIS — Z808 Family history of malignant neoplasm of other organs or systems: Secondary | ICD-10-CM | POA: Diagnosis not present

## 2018-08-01 DIAGNOSIS — R51 Headache: Secondary | ICD-10-CM | POA: Diagnosis not present

## 2018-08-01 DIAGNOSIS — E669 Obesity, unspecified: Secondary | ICD-10-CM | POA: Insufficient documentation

## 2018-08-01 DIAGNOSIS — Z803 Family history of malignant neoplasm of breast: Secondary | ICD-10-CM | POA: Diagnosis not present

## 2018-08-01 HISTORY — PX: ROBOTIC ASSISTED BILATERAL SALPINGO OOPHERECTOMY: SHX6078

## 2018-08-01 LAB — TYPE AND SCREEN
ABO/RH(D): O POS
Antibody Screen: NEGATIVE

## 2018-08-01 SURGERY — SALPINGO-OOPHORECTOMY, BILATERAL, ROBOT-ASSISTED
Anesthesia: General

## 2018-08-01 MED ORDER — ONDANSETRON HCL 4 MG/2ML IJ SOLN
INTRAMUSCULAR | Status: DC | PRN
  Administered 2018-08-01 (×2): 4 mg via INTRAVENOUS

## 2018-08-01 MED ORDER — ACETAMINOPHEN 325 MG PO TABS: 650.0000 mg | ORAL_TABLET | ORAL | Status: DC | PRN

## 2018-08-01 MED ORDER — LIDOCAINE 2% (20 MG/ML) 5 ML SYRINGE
INTRAMUSCULAR | Status: AC
  Filled 2018-08-01: qty 5

## 2018-08-01 MED ORDER — HYDROMORPHONE HCL 1 MG/ML IJ SOLN
INTRAMUSCULAR | Status: AC
  Filled 2018-08-01: qty 1

## 2018-08-01 MED ORDER — LACTATED RINGERS IR SOLN
Status: DC | PRN
  Administered 2018-08-01: 1000 mL

## 2018-08-01 MED ORDER — OXYCODONE HCL 5 MG PO TABS
ORAL_TABLET | ORAL | Status: AC
  Filled 2018-08-01: qty 1

## 2018-08-01 MED ORDER — MEPERIDINE HCL 50 MG/ML IJ SOLN: 6.2500 mg | INTRAMUSCULAR | Status: DC | PRN

## 2018-08-01 MED ORDER — SUGAMMADEX SODIUM 200 MG/2ML IV SOLN
INTRAVENOUS | Status: DC | PRN
  Administered 2018-08-01: 200 mg via INTRAVENOUS

## 2018-08-01 MED ORDER — ACETAMINOPHEN 650 MG RE SUPP: 650.0000 mg | RECTAL | Status: DC | PRN

## 2018-08-01 MED ORDER — ROCURONIUM BROMIDE 10 MG/ML (PF) SYRINGE
PREFILLED_SYRINGE | INTRAVENOUS | Status: AC
Start: 2018-08-01 — End: ?
  Filled 2018-08-01: qty 10

## 2018-08-01 MED ORDER — SODIUM CHLORIDE 0.9 % IV SOLN: 250.0000 mL | INTRAVENOUS | Status: DC | PRN

## 2018-08-01 MED ORDER — BUPIVACAINE-EPINEPHRINE (PF) 0.25% -1:200000 IJ SOLN
INTRAMUSCULAR | Status: AC
  Filled 2018-08-01: qty 30

## 2018-08-01 MED ORDER — ROCURONIUM BROMIDE 10 MG/ML (PF) SYRINGE
PREFILLED_SYRINGE | INTRAVENOUS | Status: DC | PRN
  Administered 2018-08-01: 10 mg via INTRAVENOUS
  Administered 2018-08-01: 60 mg via INTRAVENOUS
  Administered 2018-08-01: 20 mg via INTRAVENOUS

## 2018-08-01 MED ORDER — CELECOXIB 200 MG PO CAPS
400.0000 mg | ORAL_CAPSULE | ORAL | Status: AC
  Administered 2018-08-01: 400 mg via ORAL
  Filled 2018-08-01: qty 2

## 2018-08-01 MED ORDER — KETOROLAC TROMETHAMINE 30 MG/ML IJ SOLN
INTRAMUSCULAR | Status: DC | PRN
  Administered 2018-08-01: 30 mg via INTRAVENOUS

## 2018-08-01 MED ORDER — HYDROMORPHONE HCL 1 MG/ML IJ SOLN
0.2500 mg | INTRAMUSCULAR | Status: DC | PRN
  Administered 2018-08-01: 0.5 mg via INTRAVENOUS

## 2018-08-01 MED ORDER — MIDAZOLAM HCL 5 MG/5ML IJ SOLN
INTRAMUSCULAR | Status: DC | PRN
  Administered 2018-08-01: 2 mg via INTRAVENOUS

## 2018-08-01 MED ORDER — PROPOFOL 10 MG/ML IV BOLUS
INTRAVENOUS | Status: DC | PRN
  Administered 2018-08-01: 200 mg via INTRAVENOUS

## 2018-08-01 MED ORDER — MORPHINE SULFATE (PF) 4 MG/ML IV SOLN: 2.0000 mg | INTRAVENOUS | Status: DC | PRN

## 2018-08-01 MED ORDER — CEFAZOLIN SODIUM-DEXTROSE 2-4 GM/100ML-% IV SOLN
2.0000 g | INTRAVENOUS | Status: AC
  Administered 2018-08-01: 2 g via INTRAVENOUS
  Filled 2018-08-01: qty 100

## 2018-08-01 MED ORDER — KETOROLAC TROMETHAMINE 30 MG/ML IJ SOLN
INTRAMUSCULAR | Status: AC
  Filled 2018-08-01: qty 1

## 2018-08-01 MED ORDER — DEXAMETHASONE SODIUM PHOSPHATE 10 MG/ML IJ SOLN
INTRAMUSCULAR | Status: DC | PRN
  Administered 2018-08-01: 10 mg via INTRAVENOUS

## 2018-08-01 MED ORDER — PROMETHAZINE HCL 25 MG/ML IJ SOLN
INTRAMUSCULAR | Status: AC
  Filled 2018-08-01: qty 1

## 2018-08-01 MED ORDER — LACTATED RINGERS IV SOLN
INTRAVENOUS | Status: DC
  Administered 2018-08-01: 1000 mL via INTRAVENOUS
  Administered 2018-08-01 (×2): via INTRAVENOUS

## 2018-08-01 MED ORDER — SUGAMMADEX SODIUM 200 MG/2ML IV SOLN
INTRAVENOUS | Status: AC
  Filled 2018-08-01: qty 2

## 2018-08-01 MED ORDER — LIDOCAINE 2% (20 MG/ML) 5 ML SYRINGE
INTRAMUSCULAR | Status: DC | PRN
  Administered 2018-08-01: 1.5 mg/kg/h via INTRAVENOUS

## 2018-08-01 MED ORDER — LIDOCAINE HCL 2 % IJ SOLN
INTRAMUSCULAR | Status: AC
  Filled 2018-08-01: qty 40

## 2018-08-01 MED ORDER — STERILE WATER FOR IRRIGATION IR SOLN
Status: DC | PRN
  Administered 2018-08-01: 1000 mL

## 2018-08-01 MED ORDER — FENTANYL CITRATE (PF) 250 MCG/5ML IJ SOLN
INTRAMUSCULAR | Status: AC
  Filled 2018-08-01: qty 5

## 2018-08-01 MED ORDER — MIDAZOLAM HCL 2 MG/2ML IJ SOLN
INTRAMUSCULAR | Status: AC
  Filled 2018-08-01: qty 2

## 2018-08-01 MED ORDER — BUPIVACAINE-EPINEPHRINE 0.25% -1:200000 IJ SOLN
INTRAMUSCULAR | Status: DC | PRN
  Administered 2018-08-01: 30 mL

## 2018-08-01 MED ORDER — OXYCODONE-ACETAMINOPHEN 5-325 MG PO TABS: 1.0000 | ORAL_TABLET | ORAL | 0 refills | Status: DC | PRN

## 2018-08-01 MED ORDER — PHENYLEPHRINE 40 MCG/ML (10ML) SYRINGE FOR IV PUSH (FOR BLOOD PRESSURE SUPPORT)
PREFILLED_SYRINGE | INTRAVENOUS | Status: DC | PRN
  Administered 2018-08-01: 80 ug via INTRAVENOUS

## 2018-08-01 MED ORDER — FENTANYL CITRATE (PF) 250 MCG/5ML IJ SOLN
INTRAMUSCULAR | Status: DC | PRN
  Administered 2018-08-01 (×2): 50 ug via INTRAVENOUS
  Administered 2018-08-01: 100 ug via INTRAVENOUS
  Administered 2018-08-01: 50 ug via INTRAVENOUS

## 2018-08-01 MED ORDER — PROMETHAZINE HCL 25 MG/ML IJ SOLN
6.2500 mg | INTRAMUSCULAR | Status: DC | PRN
  Administered 2018-08-01: 6.25 mg via INTRAVENOUS

## 2018-08-01 MED ORDER — SODIUM CHLORIDE 0.9% FLUSH: 3.0000 mL | INTRAVENOUS | Status: DC | PRN

## 2018-08-01 MED ORDER — OXYCODONE HCL 5 MG PO TABS
5.0000 mg | ORAL_TABLET | ORAL | Status: DC | PRN
  Administered 2018-08-01: 5 mg via ORAL

## 2018-08-01 MED ORDER — LIDOCAINE 2% (20 MG/ML) 5 ML SYRINGE
INTRAMUSCULAR | Status: DC | PRN
  Administered 2018-08-01: 100 mg via INTRAVENOUS

## 2018-08-01 MED ORDER — PROPOFOL 10 MG/ML IV BOLUS
INTRAVENOUS | Status: AC
  Filled 2018-08-01: qty 20

## 2018-08-01 MED ORDER — SCOPOLAMINE 1 MG/3DAYS TD PT72
1.0000 | MEDICATED_PATCH | TRANSDERMAL | Status: DC
  Administered 2018-08-01: 1.5 mg via TRANSDERMAL
  Filled 2018-08-01: qty 1

## 2018-08-01 MED ORDER — ACETAMINOPHEN 500 MG PO TABS
1000.0000 mg | ORAL_TABLET | ORAL | Status: AC
  Administered 2018-08-01: 1000 mg via ORAL
  Filled 2018-08-01: qty 2

## 2018-08-01 MED ORDER — GABAPENTIN 300 MG PO CAPS
300.0000 mg | ORAL_CAPSULE | ORAL | Status: AC
  Administered 2018-08-01: 300 mg via ORAL
  Filled 2018-08-01: qty 1

## 2018-08-01 MED ORDER — SODIUM CHLORIDE 0.9% FLUSH: 3.0000 mL | Freq: Two times a day (BID) | INTRAVENOUS | Status: DC

## 2018-08-01 MED ORDER — DEXAMETHASONE SODIUM PHOSPHATE 4 MG/ML IJ SOLN: 4.0000 mg | INTRAMUSCULAR | Status: DC

## 2018-08-01 SURGICAL SUPPLY — 54 items
APPLICATOR COTTON TIP 6 STRL (MISCELLANEOUS) ×2 IMPLANT
APPLICATOR COTTON TIP 6IN STRL (MISCELLANEOUS) ×3
BENZOIN TINCTURE PRP APPL 2/3 (GAUZE/BANDAGES/DRESSINGS) IMPLANT
CHLORAPREP W/TINT 26ML (MISCELLANEOUS) ×3 IMPLANT
COVER BACK TABLE 60X90IN (DRAPES) IMPLANT
COVER TIP SHEARS 8 DVNC (MISCELLANEOUS) ×2 IMPLANT
COVER TIP SHEARS 8MM DA VINCI (MISCELLANEOUS) ×1
DRAPE ARM DVNC X/XI (DISPOSABLE) ×8 IMPLANT
DRAPE COLUMN DVNC XI (DISPOSABLE) ×2 IMPLANT
DRAPE DA VINCI XI ARM (DISPOSABLE) ×4
DRAPE DA VINCI XI COLUMN (DISPOSABLE) ×1
DRAPE SHEET LG 3/4 BI-LAMINATE (DRAPES) ×3 IMPLANT
DRAPE SURG IRRIG POUCH 19X23 (DRAPES) ×3 IMPLANT
DRAPE UNDERBUTTOCKS STRL (DRAPE) ×3 IMPLANT
DRSG TELFA 3X8 NADH (GAUZE/BANDAGES/DRESSINGS) IMPLANT
ELECT REM PT RETURN 15FT ADLT (MISCELLANEOUS) ×3 IMPLANT
GLOVE BIO SURGEON STRL SZ 6.5 (GLOVE) ×3 IMPLANT
GLOVE BIOGEL PI IND STRL 7.0 (GLOVE) ×4 IMPLANT
GLOVE BIOGEL PI INDICATOR 7.0 (GLOVE) ×2
GLOVE SURG SS PI 6.5 STRL IVOR (GLOVE) ×9 IMPLANT
GOWN STRL REUS W/ TWL LRG LVL3 (GOWN DISPOSABLE) ×2 IMPLANT
GOWN STRL REUS W/TWL LRG LVL3 (GOWN DISPOSABLE) ×1 IMPLANT
GOWN STRL REUS W/TWL XL LVL3 (GOWN DISPOSABLE) ×6 IMPLANT
HOLDER FOLEY CATH W/STRAP (MISCELLANEOUS) ×3 IMPLANT
IRRIG SUCT STRYKERFLOW 2 WTIP (MISCELLANEOUS) ×3
IRRIGATION SUCT STRKRFLW 2 WTP (MISCELLANEOUS) ×2 IMPLANT
MARKER SKIN DUAL TIP RULER LAB (MISCELLANEOUS) ×3 IMPLANT
OBTURATOR OPTICAL STANDARD 8MM (TROCAR) ×1
OBTURATOR OPTICAL STND 8 DVNC (TROCAR) ×2
OBTURATOR OPTICALSTD 8 DVNC (TROCAR) ×2 IMPLANT
PACK ROBOT GYN CUSTOM WL (TRAY / TRAY PROCEDURE) ×3 IMPLANT
PAD POSITIONING PINK XL (MISCELLANEOUS) ×3 IMPLANT
PORT ACCESS TROCAR AIRSEAL 12 (TROCAR) ×2 IMPLANT
PORT ACCESS TROCAR AIRSEAL 5M (TROCAR) ×1
POUCH SPECIMEN RETRIEVAL 10MM (ENDOMECHANICALS) ×6 IMPLANT
SCISSORS LAP 5X35 DISP (ENDOMECHANICALS) ×3 IMPLANT
SEAL CANN UNIV 5-8 DVNC XI (MISCELLANEOUS) ×8 IMPLANT
SEAL XI 5MM-8MM UNIVERSAL (MISCELLANEOUS) ×4
SEALER TISSUE G2 STRG ARTC 35C (ENDOMECHANICALS) ×3 IMPLANT
SEPRAFILM MEMBRANE 5X6 (MISCELLANEOUS) IMPLANT
SET TRI-LUMEN FLTR TB AIRSEAL (TUBING) ×3 IMPLANT
SPONGE SURGIFOAM ABS GEL 100 (HEMOSTASIS) IMPLANT
STRIP CLOSURE SKIN 1/2X4 (GAUZE/BANDAGES/DRESSINGS) IMPLANT
SUT MNCRL AB 4-0 PS2 18 (SUTURE) IMPLANT
SUT VIC AB 0 CT1 18XCR BRD 8 (SUTURE) IMPLANT
SUT VIC AB 0 CT1 8-18 (SUTURE)
SUT VIC AB 0 CTX 27 (SUTURE) IMPLANT
SUT VIC AB 4-0 P2 18 (SUTURE) ×6 IMPLANT
SUT VICRYL 0 UR6 27IN ABS (SUTURE) ×3 IMPLANT
TOWEL OR 17X26 10 PK STRL BLUE (TOWEL DISPOSABLE) IMPLANT
TOWEL OR NON WOVEN STRL DISP B (DISPOSABLE) ×3 IMPLANT
TRAP SPECIMEN MUCOUS 40CC (MISCELLANEOUS) ×3 IMPLANT
TRAY FOLEY CATH 14FR (SET/KITS/TRAYS/PACK) ×3 IMPLANT
UNDERPAD 30X30 (UNDERPADS AND DIAPERS) ×3 IMPLANT

## 2018-08-01 NOTE — H&P (Signed)
Progress Note : GYN-ONC Established Patient FOLLOW-UP  Consult was originally requested by Dr. Gari Crown  CC:     Chief Complaint  Patient presents with  . Endometrial adenocarcinoma [facility])    HPI: Ms. Eileen Rodriguez  is a very nice 57 y.o. P4   Interval History Last seen 06/2018  Recall she wanted to hold off on any further surgery due to the wedding of her daughter. She is now ready to proceed with surgery.   CXR 05/24/2018 was NED.  She presents for surgery today   Presenting / Chronicle of Oncologic History The patient had gone almost 1 year with no menses and then in August 2018 she had 2 months of bleeding again.  This was mentioned to her primary care provider and an ultrasound was ordered.  This revealed an endometrial thickness of 20 mm and a 10 mm polypoid lesion.  She was then referred on to Dr. Evie Lacks and on 03/14/2018 and endometrial biopsy revealed complex hyperplasia with atypia.  The patient desired to proceed with definitive hysterectomy and was taken the operating room 04/05/2018 (~ 3 weeks prior to today) where a total abdominal hysterectomy was performed at [facility] in [place], [place].  Unfortunately the final pathology on the uterus revealed an endometrial cancer with pathology as follows: Grade 1 Endometrioid Endometrial Adenocarcinoma No myometrial invasion was seen. No lymph-vascular invasion was seen. No cervical stromal or serosal involvement.  Her ovaries and tubes remain in-situ.   She is therefore at least a Stage IA, Grade 1 EM Uterine CA.  OK with Dermabond  Measurement of disease:   TBD   Radiology:  02/23/2018-TA/TVUS-[facility] rocking him-uterus 9.6 x 6.1 x 7 with a 20 mm endometrial stripe normal adnexa bilaterally with a left ovary noted to have 1 cm cyst likely dominant follicle.  05/24/2018 CXR - NED  06/13/18 - TVUS - Uterus Surgically absent Right ovary Measurements: 2.6 x 1.5 x 2.0 cm. Normal appearance/no adnexal  mass. Left ovary Measurements: 3.0 x 1.9 x 2.4 cm. Within the left adnexa there is a 4.0 x 3.7 x 1.1 cm tubular shaped cystic structure Other findings None IMPRESSION: Tubular shaped cystic structure measuring up to 4 cm within the left adnexa which is nonspecific in etiology and may represent postoperative seroma or potentially hydrosalpinx. Recommend follow-up pelvic ultrasound in 2-3 months to assess for interval change. Electronically Signed   By: Lovey Newcomer M.D.    EPIC Oncologic History:   No history exists.    Current Meds:  No outpatient encounter medications on file as of 06/19/2018.   No facility-administered encounter medications on file as of 06/19/2018.     Allergy:  Allergies  Allergen Reactions  . Tape Rash    Bandaids  She has had Dermabond application without reaction  Social Hx:   Tobacco use: none EtOH use: none Illicit Drug use: none IV Drug use: not asked  Past Surgical Hx:       Past Surgical History:  Procedure Laterality Date  . ABDOMINAL HYSTERECTOMY  03/2018  . APPENDECTOMY     6 months pregnant  . WISDOM TOOTH EXTRACTION      Past Medical Hx:      Past Medical History:  Diagnosis Date  . Uterine polyp     Past Gynecological History:   GYNECOLOGIC HISTORY:  No LMP recorded. Patient has had a hysterectomy. Menarche: 57 years old P 4 LMP 02/2018 Contraceptive OCPs less than 10 years HRT none Last Pap March 2019  Family Hx:       Family History  Problem Relation Age of Onset  . Thyroid disease Mother 43       treated with radiation  . Breast cancer Maternal Grandmother 60       with metastases to the brain    Review of Systems:  Review of Systems  Constitutional: Negative.   HENT:  Negative.   Eyes: Negative.   Respiratory: Negative.   Cardiovascular: Negative.   Gastrointestinal: Negative.   Endocrine: Negative.   Genitourinary: Negative.    Musculoskeletal: Negative.   Skin: Negative.    Neurological: Negative.   Hematological: Negative.   Psychiatric/Behavioral: Negative.     Vitals:   08/01/18 0601  BP: (!) 158/94  Pulse: 82  Resp: 16  Temp: (!) 97.2 F (36.2 C)  SpO2: 99%      Physical Exam: from OV 06/2018 ECOG PERFORMANCE STATUS: 0 - Asymptomatic   General :         Overweight, well developed, 57 y.o., female in no apparent distress HEENT:           Normocephalic/atraumatic, symmetric, EOMI, eyelids normal Neck:               Supple, no masses.  Lymphatics:     No cervical/ submandibular/ supraclavicular/ infraclavicular/ inguinal adenopathy Respiratory:     Respirations unlabored, no use of accessory muscles CV:                  Deferred Breast:             Deferred Musculoskeletal: No CVA tenderness, normal muscle strength. Abdomen:        Midline vertical incision, healed. Soft, non-tender and nondistended. No evidence of hernia. No masses. Extremities:     No lymphedema, no erythema, non-tender. Skin:                Normal inspection Neuro/Psych:  No focal motor deficit, no abnormal mental status. Normal gait. Normal affect. Alert and oriented to person, place, and time  Genito Urinary: Vulva: Normal external female genitalia.  Bladder/urethra: Urethral meatus normal in size and location. No lesions or   masses, well supported bladder Speculum exam: Vagina: No lesion, no discharge, no bleeding. Bimanual exam: Cervix/Uterus: Surgically absent             Adnexa: No masses. Rectovaginal:  Good tone, no masses, no cul de sac nodularity, no parametrial involvement or nodularity.    Oncologic Summary: 1. At least Stage IA, Grade 1 endometrioid EM CA  Assessment/Plan: 1. Previous counseling for uterine cancer ? We discussed the diagnosis of uterine cancer and the typical presentation and prognosis ? We reviewed standard of care being hysterectomy with BSO  ? Regarding lymphadenectomy; given this is not invading the myometrium and there  is no LVSI along with a grade 1 tumor the risk of involvement of the lymph nodes is quite low and so I do not think proceeding with lymphadenectomy is necessary 2. Surgical management of the adnexa ? TVUS shows a cyst.  ? I recommend proceeding with BSO ? We talked about the surgery today and hopes this will be laparoscopic. Frozen section will be sent. She knows there is an increased risk of laparotomy given the prior abdominal surgeries (TAH and Open Appy) ? Surgical sketch was reviewed and she is to be given a copy. 3. Pending items  ? MMR on outside hysterectomy specimen to see if risk of Lynch  Isabel Caprice, MD 08/01/18

## 2018-08-01 NOTE — Op Note (Signed)
OPERATIVE NOTE  Date: 08/01/18  Preoperative Diagnosis: At least Stage IA Endometrial Adenocarcinoma with ovaries in-situ   Postoperative Diagnosis:  Same  Procedure(s) Performed:  1. Robotic-assisted laparoscopic bilateral salpingoophorectomy (increased difficulty - see findings) 2. Pelvic washings  Surgeon: Bernita Raisin, MD  Assistant Surgeon:  Lahoma Crocker M.D. (an MD assistant was necessary for tissue manipulation, management of robotic instrumentation, retraction and positioning due to the complexity of the case and hospital policies).   Anesthesia: GETA  Specimens: bilateral ovaries, bilateral fallopian tubes, pelvic washings  Complications: None   Indication for Procedure:  The patient had a history of unanticipated endometrial cancer and has ovaries remaining in-situ  Operative Findings: Increased difficulty due to significant adhesive disease of omentum to anterior abdominal wall from hysterectomy and large bowel mesentary to bilateral pelvic sidewalls. Once adhesions were taken down the ovaries appeared normal.  Procedure in Detail:  The patient was positioned supine with her arms at her sides prior to anesthesia to ensure comfort. The patient was taken to the operating room and placed under general endotracheal anesthesia without difficulty. She was placed in a dorsolithotomy position and cervical acromial pads were placed. The patient had sequential compression devices for VTE prophylaxis.  The patient was then prepped in the usual sterile fashion.  Time out was performed.   I placed the foley and then a sponge-stick in the vagina.   A 14mm incision was made in the left upper quadrant palmer's point and a 5 mm Optiview trocar used to enter the abdomen under direct visualization. With entry into the abdomen and then maintenance of 15 mm of mercury the patient was placed in Trendelenburg position. Findings as noted. We had to place the second trocar in the left  lateral site about 6-8cm from the planned umbilical site due to adhesive disease. This was placed under direct visualization. Looking up at the 69mm entry site no obvious injury. There were omental adhesions approximating this area in addition to all along the vertical incision from her prior hysterectomy. The sharp monopolar scissors and 1mm Enseal were used to take down the omental disease as far cephalad as the planned superior supraumblical incision. ~16 minutes of lysis of adhesions  At this point I was able to insert the remaining trocars. An incision was made superior to the umbilicus and used for an 64mm trocar. To the right of this 2 additional 31mm trocars were placed approximately 6-8 cm from the closest trocar. The 72mm LUQ trocar was changed out to 1mm airseal.   Washings were obtained. The robot was docked. The abdomen was inspected as was the pelvis.  Findings as noted. Additional 30 minutes of adhesiolysis in the pelvis where the large bowel mesentary was adhered to the bilateral adnexae and sidewalls. Once the right side was cleared of bowel adhesions an incision was made on the right pelvic side wall peritoneum parallel to the IP ligament and the retroperitoneal space entered. The right ureter was identified and the para-rectal space was developed. A window was created in the broad ligament above the ureter. The infundibulopelvic vessels were skeletonized cauterized and transected. The broad ligament was followed caudad to the ovary and then adhesiolysis around the adnexa to free it. The site of round ligament adhesion was coagulated and transected. Finally the site where presumed vaginal cuff adhesion was present was lysed, cauterized, and transected. Specimen was placed in an Endo Catch bag.  In a similar manner the contralateral adnexa was isolated, the spaces opened, the ureter  identified, and the IP ligament isolated, clamped, coagulated and transected. . The broad ligament was followed  caudad to the ovary and then adhesiolysis around the adnexa to free it. The site of round ligament adhesion was coagulated and transected. This adnexa was placed in a separate Endo catch bag and a knot placed to identify laterality.  The operative sites were inspected and hemostasis was noted.   The robot was undocked. The bags holding the ovaries was removed through the supraumbilical trocar site after expanding this to accommodate the endocatch bags. The ports were all removed. The fascial closure at the left upper quadrant port was made with 0 Vicryl.  All incisions were closed with interrupted 4-0 vicryl and running subcuticular 4-0 Monocryl suture. Dermabond was applied.  Sponge, lap and needle counts were correct x 2.          Disposition: PACU -stable

## 2018-08-01 NOTE — Interval H&P Note (Signed)
History and Physical Interval Note:  08/01/2018 6:52 AM  Eileen Rodriguez  has presented today for surgery, with the diagnosis of HISTORY OF UTERINE CANCER  The various methods of treatment have been discussed with the patient and family. After consideration of risks, benefits and other options for treatment, the patient has consented to  Procedure(s): XI ROBOTIC [place] (Bilateral) POSSIBLE LAPAROTOMY (N/A) as a surgical intervention .  The patient's history has been reviewed, patient examined, no change in status, stable for surgery.  I have reviewed the patient's chart and labs.  Questions were answered to the patient's satisfaction.     Isabel Caprice

## 2018-08-01 NOTE — Anesthesia Procedure Notes (Signed)
Procedure Name: Intubation Date/Time: 08/01/2018 7:38 AM Performed by: [place] Rhylee Pucillo C, CRNA Pre-anesthesia Checklist: Patient identified, Emergency Drugs available, Suction available and Patient being monitored Patient Re-evaluated:Patient Re-evaluated prior to induction Oxygen Delivery Method: Circle system utilized Preoxygenation: Pre-oxygenation with 100% oxygen Induction Type: IV induction Ventilation: Mask ventilation without difficulty Laryngoscope Size: Mac and 3 Grade View: Grade III Tube type: Oral Tube size: 7.0 mm Number of attempts: 1 Airway Equipment and Method: Stylet and Oral airway Placement Confirmation: ETT inserted through vocal cords under direct vision,  positive ETCO2 and breath sounds checked- equal and bilateral Secured at: 21 cm Tube secured with: Tape Dental Injury: Teeth and Oropharynx as per pre-operative assessment  Comments: DL x 1 by ENT student unable to visualize VC; DL x 1 by S [place] grade 3 view, 7.0 ETT passed easily through [facility]

## 2018-08-01 NOTE — Discharge Instructions (Signed)
Bilateral Salpingo-Oophorectomy, Care After This sheet gives you information about how to care for yourself after your procedure. Your health care provider may also give you more specific instructions. If you have problems or questions, contact your health care provider. What can I expect after the procedure? After the procedure, it is common to have:  Abdominal pain.  Some occasional vaginal bleeding (spotting).  Tiredness.  Symptoms of menopause, such as hot flashes, night sweats, or mood swings.  Follow these instructions at home: Incision care  Keep your incision area and your bandage (dressing) clean and dry.  Follow instructions from your health care provider about how to take care of your incision. Make sure you: ? Wash your hands with soap and water before you change your dressing. If soap and water are not available, use hand sanitizer. ? Change your dressing as told by your health care provider. ? Leave stitches (sutures), staples, skin glue, or adhesive strips in place. These skin closures may need to stay in place for 2 weeks or longer. If adhesive strip edges start to loosen and curl up, you may trim the loose edges. Do not remove adhesive strips completely unless your health care provider tells you to do that.  Check your incision area every day for signs of infection. Check for: ? Redness, swelling, or pain. ? Fluid or blood. ? Warmth. ? Pus or a bad smell. Activity  Do not drive or use heavy machinery while taking prescription pain medicine.  Do not drive for 24 hours if you received a medicine to help you relax (sedative) during your procedure.  Take frequent, short walks throughout the day. Rest when you get tired. Ask your health care provider what activities are safe for you.  Avoid activity that requires great effort. Also, avoid heavy lifting. Do not lift anything that is heavier than 10 lbs. (4.5 kg), or the limit that your health care provider tells you,  until he or she says that it is safe to do so.  Do not douche, use tampons, or have sex until your health care provider approves. General instructions  To prevent or treat constipation while you are taking prescription pain medicine, your health care provider may recommend that you: ? Drink enough fluid to keep your urine clear or pale yellow. ? Take over-the-counter or prescription medicines. ? Eat foods that are high in fiber, such as fresh fruits and vegetables, whole grains, and beans. ? Limit foods that are high in fat and processed sugars, such as fried and sweet foods.  Take over-the-counter and prescription medicines only as told by your health care provider.  Do not take baths, swim, or use a hot tub until your health care provider approves. Ask your health care provider if you can take showers. You may only be allowed to take sponge baths for bathing.  Wear compression stockings as told by your health care provider. These stockings help to prevent blood clots and reduce swelling in your legs.  Keep all follow-up visits as told by your health care provider. This is important. Contact a health care provider if:  You have pain when you urinate.  You have pus or a bad smelling discharge coming from your vagina.  You have redness, swelling, or pain around your incision.  You have fluid or blood coming from your incision.  Your incision feels warm to the touch.  You have pus or a bad smell coming from your incision.  You have a fever.  Your incision  starts to break open.  You have pain in the abdomen, and it gets worse or does not get better when you take medicine.  You develop a rash.  You develop nausea and vomiting.  You feel lightheaded. Get help right away if:  You develop pain in your chest or leg.  You become short of breath.  You faint.  You have increased bleeding from your vagina. Summary  After the procedure, it is common to have pain, bleeding in  the vagina, and symptoms of menopause.  Follow instructions from your health care provider about how to take care of your incision.  Follow instructions from your health care provider about activities and restrictions.  Check your incision every day for signs of infection and report any symptoms to your health care provider. This information is not intended to replace advice given to you by your health care provider. Make sure you discuss any questions you have with your health care provider. Document Released: 12/06/2005 Document Revised: 01/10/2017 Document Reviewed: 01/10/2017 Elsevier Interactive Patient Education  2018 [place] Anesthesia, Adult, Care After These instructions provide you with information about caring for yourself after your procedure. Your health care provider may also give you more specific instructions. Your treatment has been planned according to current medical practices, but problems sometimes occur. Call your health care provider if you have any problems or questions after your procedure. What can I expect after the procedure? After the procedure, it is common to have:  Vomiting.  A sore throat.  Mental slowness.  It is common to feel:  Nauseous.  Cold or shivery.  Sleepy.  Tired.  Sore or achy, even in parts of your body where you did not have surgery.  Follow these instructions at home: For at least 24 hours after the procedure:  Do not: ? Participate in activities where you could fall or become injured. ? Drive. ? Use heavy machinery. ? Drink alcohol. ? Take sleeping pills or medicines that cause drowsiness. ? Make important decisions or sign legal documents. ? Take care of children on your own.  Rest. Eating and drinking  If you vomit, drink water, juice, or soup when you can drink without vomiting.  Drink enough fluid to keep your urine clear or pale yellow.  Make sure you have little or no nausea before eating solid  foods.  Follow the diet recommended by your health care provider. General instructions  Have a responsible adult stay with you until you are awake and alert.  Return to your normal activities as told by your health care provider. Ask your health care provider what activities are safe for you.  Take over-the-counter and prescription medicines only as told by your health care provider.  If you smoke, do not smoke without supervision.  Keep all follow-up visits as told by your health care provider. This is important. Contact a health care provider if:  You continue to have nausea or vomiting at home, and medicines are not helpful.  You cannot drink fluids or start eating again.  You cannot urinate after 8-12 hours.  You develop a skin rash.  You have fever.  You have increasing redness at the site of your procedure. Get help right away if:  You have difficulty breathing.  You have chest pain.  You have unexpected bleeding.  You feel that you are having a life-threatening or urgent problem. This information is not intended to replace advice given to you by your health care provider.  Make sure you discuss any questions you have with your health care provider. Document Released: 03/14/2001 Document Revised: 05/10/2016 Document Reviewed: 11/20/2015 Elsevier Interactive Patient Education  2018 [place].   Bilateral Salpingo-Oophorectomy, Care After This sheet gives you information about how to care for yourself after your procedure. Your health care provider may also give you more specific instructions. If you have problems or questions, contact your health care provider. What can I expect after the procedure? After the procedure, it is common to have:  Abdominal pain.  Some occasional vaginal bleeding (spotting).  Tiredness.  Symptoms of menopause, such as hot flashes, night sweats, or mood swings.  Follow these instructions at home: Incision care  Keep your  incision area and your bandage (dressing) clean and dry.  Follow instructions from your health care provider about how to take care of your incision. Make sure you: ? Wash your hands with soap and water before you change your dressing. If soap and water are not available, use hand sanitizer. ? Change your dressing as told by your health care provider. ? Leave stitches (sutures), staples, skin glue, or adhesive strips in place. These skin closures may need to stay in place for 2 weeks or longer. If adhesive strip edges start to loosen and curl up, you may trim the loose edges. Do not remove adhesive strips completely unless your health care provider tells you to do that.  Check your incision area every day for signs of infection. Check for: ? Redness, swelling, or pain. ? Fluid or blood. ? Warmth. ? Pus or a bad smell. Activity  Do not drive or use heavy machinery while taking prescription pain medicine.  Do not drive for 24 hours if you received a medicine to help you relax (sedative) during your procedure.  Take frequent, short walks throughout the day. Rest when you get tired. Ask your health care provider what activities are safe for you.  Avoid activity that requires great effort. Also, avoid heavy lifting. Do not lift anything that is heavier than 10 lbs. (4.5 kg), or the limit that your health care provider tells you, until he or she says that it is safe to do so.  Do not douche, use tampons, or have sex until your health care provider approves. General instructions  To prevent or treat constipation while you are taking prescription pain medicine, your health care provider may recommend that you: ? Drink enough fluid to keep your urine clear or pale yellow. ? Take over-the-counter or prescription medicines. ? Eat foods that are high in fiber, such as fresh fruits and vegetables, whole grains, and beans. ? Limit foods that are high in fat and processed sugars, such as fried and  sweet foods.  Take over-the-counter and prescription medicines only as told by your health care provider.  Do not take baths, swim, or use a hot tub until your health care provider approves. Ask your health care provider if you can take showers. You may only be allowed to take sponge baths for bathing.  Wear compression stockings as told by your health care provider. These stockings help to prevent blood clots and reduce swelling in your legs.  Keep all follow-up visits as told by your health care provider. This is important. Contact a health care provider if:  You have pain when you urinate.  You have pus or a bad smelling discharge coming from your vagina.  You have redness, swelling, or pain around your incision.  You have fluid or  blood coming from your incision.  Your incision feels warm to the touch.  You have pus or a bad smell coming from your incision.  You have a fever.  Your incision starts to break open.  You have pain in the abdomen, and it gets worse or does not get better when you take medicine.  You develop a rash.  You develop nausea and vomiting.  You feel lightheaded. Get help right away if:  You develop pain in your chest or leg.  You become short of breath.  You faint.  You have increased bleeding from your vagina. Summary  After the procedure, it is common to have pain, bleeding in the vagina, and symptoms of menopause.  Follow instructions from your health care provider about how to take care of your incision.  Follow instructions from your health care provider about activities and restrictions.  Check your incision every day for signs of infection and report any symptoms to your health care provider. This information is not intended to replace advice given to you by your health care provider. Make sure you discuss any questions you have with your health care provider. Document Released: 12/06/2005 Document Revised: 01/10/2017 Document  Reviewed: 01/10/2017 Elsevier Interactive Patient Education  [employer].

## 2018-08-01 NOTE — Anesthesia Postprocedure Evaluation (Signed)
Anesthesia Post Note  Patient: Eileen Rodriguez  Procedure(s) Performed: XI ROBOTIC ASSISTED BILATERAL SALPINGO OOPHORECTOMY (Bilateral )     Patient location during evaluation: PACU Anesthesia Type: General Level of consciousness: sedated and patient cooperative Pain management: pain level controlled Vital Signs Assessment: post-procedure vital signs reviewed and stable Respiratory status: spontaneous breathing Cardiovascular status: stable Anesthetic complications: no    Last Vitals:  Vitals:   08/01/18 1130 08/01/18 1220  BP: 123/74 (!) 149/76  Pulse: 62 66  Resp: 12 12  Temp: 37.3 C 36.9 C  SpO2: 95% 98%    Last Pain:  Vitals:   08/01/18 1220  TempSrc: Oral  PainSc: 2                  Nolon Nations

## 2018-08-01 NOTE — Transfer of Care (Signed)
Immediate Anesthesia Transfer of Care Note  Patient: Eileen Rodriguez  Procedure(s) Performed: XI ROBOTIC ASSISTED BILATERAL SALPINGO OOPHORECTOMY (Bilateral )  Patient Location: PACU  Anesthesia Type:General  Level of Consciousness: awake, alert  and oriented  Airway & Oxygen Therapy: Patient Spontanous Breathing and Patient connected to face mask oxygen  Post-op Assessment: Report given to RN and Post -op Vital signs reviewed and stable  Post vital signs: Reviewed and stable  Last Vitals:  Vitals Value Taken Time  BP 146/83 08/01/2018 10:25 AM  Temp    Pulse 82 08/01/2018 10:26 AM  Resp 11 08/01/2018 10:26 AM  SpO2 97 % 08/01/2018 10:26 AM  Vitals shown include unvalidated device data.  Last Pain:  Vitals:   08/01/18 0646  TempSrc:   PainSc: 0-No pain      Patients Stated Pain Goal: 4 (29/09/03 0149)  Complications: No apparent anesthesia complications

## 2018-08-02 ENCOUNTER — Telehealth: Payer: Self-pay

## 2018-08-02 ENCOUNTER — Encounter (HOSPITAL_COMMUNITY): Payer: Self-pay | Admitting: Obstetrics

## 2018-08-02 NOTE — Telephone Encounter (Signed)
Outgoing call per Joylene John NP regarding surgical path report:  "No cancer"  " How is she doing post-op?"  Pt voiced understanding of results and reports she is doing well. Pt asked about pelvic washings result and per Melissa NP - "they were normal cells - no cancer"- Pt voiced understanding. No other needs per pt at this time. Reminded of upcoming follow up appt.

## 2018-08-04 ENCOUNTER — Telehealth: Payer: Self-pay

## 2018-08-04 NOTE — Telephone Encounter (Signed)
Told Ms Dye that the pathology showed no cancer in her ovaries or fallopian tubes per Joylene John, NP.  Kep postop appointment as scheduled for 08-11-18.

## 2018-08-04 NOTE — Telephone Encounter (Signed)
LM for patient to call to discuss results of surgical pathology from 08-01-18.

## 2018-08-10 NOTE — Progress Notes (Deleted)
[place] at [facility]   Progress Note : GYN-ONC Established Patient [place] was originally requested by Dr. Gari Crown  CC:  No chief complaint on file.  Oncologic Summary: 1. At least Stage IA, Grade 1 endometrioid EM CA   HPI: Ms. Eileen Rodriguez  is a very nice 57 y.o. P4  . Interval History Patient returns for an early postop I took her for laparoscopic (robo-asssist) BSO 08/01/18 to complete her surgery for endometrial adenoca. She did have adhesive disease increasing the difficulty of the surgery. Thankfullly final pathology showed benign bilateral adnexa  ***  . Presenting / Chronicle of Oncologic History The patient had gone almost 1 year with no menses and then in August 2018 she had 2 months of bleeding again.  This was mentioned to her primary care provider and an ultrasound was ordered.  This revealed an endometrial thickness of 20 mm and a 10 mm polypoid lesion.  She was then referred on to Dr. Evie Lacks and on 03/14/2018 and endometrial biopsy revealed complex hyperplasia with atypia.  The patient desired to proceed with definitive hysterectomy and was taken the operating room 04/05/2018 (~ 3 weeks prior to today) where a total abdominal hysterectomy was performed at [facility] in [place], [place].  Unfortunately the final pathology on the uterus revealed an endometrial cancer with pathology as follows: Grade 1 Endometrioid Endometrial Adenocarcinoma No myometrial invasion was seen. No lymph-vascular invasion was seen. No cervical stromal or serosal involvement.  Her ovaries and tubes remain in-situ.   She is therefore at least a Stage IA, Grade 1 EM Uterine CA. She postponed removal of her adnexa until after her daughter's wedding.  On 08/01/18 I removed the adnexa and they were both benign.  OK with Dermabond  Measurement of disease:  . TBD   Radiology: . 02/23/2018-TA/TVUS-[facility] rocking him-uterus 9.6 x 6.1 x  7 with a 20 mm endometrial stripe normal adnexa bilaterally with a left ovary noted to have 1 cm cyst likely dominant follicle. . 05/24/2018 CXR - NED . 06/13/18 - TVUS - Uterus Surgically absent Right ovary Measurements: 2.6 x 1.5 x 2.0 cm. Normal appearance/no adnexal mass. Left ovary Measurements: 3.0 x 1.9 x 2.4 cm. Within the left adnexa there is a 4.0 x 3.7 x 1.1 cm tubular shaped cystic structure Other findings None IMPRESSION: Tubular shaped cystic structure measuring up to 4 cm within the left adnexa which is nonspecific in etiology and may represent postoperative seroma or potentially hydrosalpinx. Recommend follow-up pelvic ultrasound in 2-3 months to assess for interval change. Electronically Signed   By: Lovey Newcomer M.D.    EPIC Oncologic History:   No history exists.    Current Meds:  Outpatient Encounter Medications as of 08/11/2018  Medication Sig  . Cats Claw, Uncaria tomentosa, (CATS CLAW PO) Take 1 tablet by mouth 2 (two) times daily.  Marland Kitchen OVER THE COUNTER MEDICATION Take 6 drops by mouth daily. Iodine otc liquid supplement  . oxyCODONE-acetaminophen (PERCOCET/ROXICET) 5-325 MG tablet Take 1-2 tablets by mouth every 4 (four) hours as needed for severe pain.   No facility-administered encounter medications on file as of 08/11/2018.     Allergy:  Allergies  Allergen Reactions  . Tape Rash    Bandaids  She has had Dermabond application without reaction  Social Hx:   Tobacco use: none EtOH use: none Illicit Drug use: none IV Drug use: not asked  Past Surgical Hx:  Past Surgical  History:  Procedure Laterality Date  . ABDOMINAL HYSTERECTOMY  03/2018  . APPENDECTOMY     6 months pregnant  . Laparascopic bil oophorectomy     Dr. Gerarda Fraction 08-01-18  . ROBOTIC ASSISTED BILATERAL SALPINGO OOPHERECTOMY Bilateral 08/01/2018   Procedure: XI ROBOTIC ASSISTED BILATERAL SALPINGO OOPHORECTOMY;  Surgeon: Isabel Caprice, MD;  Location: [facility];  Service: Gynecology;  Laterality:  Bilateral;  . WISDOM TOOTH EXTRACTION      Past Medical Hx:  Past Medical History:  Diagnosis Date  . Anxiety   . Cancer [facility])    Uterine cancer  . Headache    migraines rare  . History of kidney stones    1999  . Pre-diabetes   . Uterine polyp     Past Gynecological History:   GYNECOLOGIC HISTORY:  No LMP recorded. Patient has had a hysterectomy. Menarche: 57 years old P 4 LMP 02/2018 Contraceptive OCPs less than 10 years HRT none  Last Pap March 2019  Family Hx:  Family History  Problem Relation Age of Onset  . Thyroid disease Mother 48       treated with radiation  . Breast cancer Maternal Grandmother 60       with metastases to the brain    Review of Systems:  Review of Systems  Constitutional: Negative.   HENT:  Negative.   Eyes: Negative.   Respiratory: Negative.   Cardiovascular: Negative.   Gastrointestinal: Negative.   Endocrine: Negative.   Genitourinary: Negative.    Musculoskeletal: Negative.   Skin: Negative.   Neurological: Negative.   Hematological: Negative.   Psychiatric/Behavioral: Negative.     Vitals:  There were no vitals taken for this visit. There is no height or weight on file to calculate BMI.   Physical Exam: ECOG PERFORMANCE STATUS: 0 - Asymptomatic   General :  Overweight, well developed, 57 y.o., female in no apparent distress HEENT:  Normocephalic/atraumatic, symmetric, EOMI, eyelids normal Neck:   No visible masses.  Respiratory:  Respirations unlabored, no use of accessory muscles CV:   Deferred Breast:  Deferred Musculoskeletal: Normal muscle strength. Abdomen:  *** No visible masses or protrusion Extremities:  No visible edema or deformities Skin:   Normal inspection Neuro/Psych:  No focal motor deficit, no abnormal mental status. Normal gait. Normal affect. Alert and oriented to person, place, and time    Assessment/Plan: 1. Pending items  ? MMR on outside hysterectomy specimen to see if risk of  [place] 2. Her husband was present with the patient for the discussion with no further questions after review of the above 3. Recovering *** 4. Activity restrictions reinforced. 5. RTC ***  Isabel Caprice, MD  08/10/2018, 10:20 AM  Cc: Gari Crown, MD (Referring OB/GYN)

## 2018-08-11 ENCOUNTER — Ambulatory Visit: Payer: BLUE CROSS/BLUE SHIELD | Admitting: Obstetrics

## 2018-08-11 ENCOUNTER — Telehealth: Payer: Self-pay

## 2018-08-11 NOTE — Telephone Encounter (Signed)
LM for Eileen Rodriguez stating that her message was received to cancel today's appointment.  Requested she call back to Dr. Gerarda Fraction' office to reschedule appointment.

## 2018-08-14 NOTE — Telephone Encounter (Signed)
Spoke with Ms Creson and r/s her post op appointment from 08-11-18.

## 2018-08-21 NOTE — Progress Notes (Signed)
[place] at [facility]   Progress Note : GYN-ONC Established Patient [place] was originally requested by Dr. Gari Crown  CC:  Chief Complaint  Patient presents with  . Endometrial adenocarcinoma [facility])   Oncologic Summary: 1. At least Stage IA, Grade 1 endometrioid EM CA   HPI: Ms. Eileen Rodriguez  is a very nice 57 y.o. P4  . Interval History Patient returns for (a rescheduled from last week) postop. I took her for laparoscopic (robo-asssist) BSO 08/01/18 to complete her surgery for endometrial adenoca. She did have adhesive disease increasing the difficulty of the surgery. Thankfullly final pathology showed benign bilateral adnexa 1. Ovary and fallopian tube, left - BENIGN UNREMARKABLE OVARY AND FALLOPIAN TUBE. - NEGATIVE FOR MALIGNANCY. 2. Ovary and fallopian tube, right - BENIGN UNREMARKABLE OVARY AND FALLOPIAN TUBE. - NEGATIVE FOR MALIGNANCY.  She is doing well. Currently pain controlled, no N/V, no fevers. Normal bowel/bladder function. She admits to lifting more than she should have. (Toddler in her care). She did have episode of nausea/diarrhea and fever about a week ago, no emesis; but since that time no issues other than occasional stomach cramp.  . Presenting / Chronicle of Oncologic History The patient had gone almost 1 year with no menses and then in August 2018 she had 2 months of bleeding again.  This was mentioned to her primary care provider and an ultrasound was ordered.  This revealed an endometrial thickness of 20 mm and a 10 mm polypoid lesion.  She was then referred on to Dr. Evie Lacks and on 03/14/2018 and endometrial biopsy revealed complex hyperplasia with atypia.  The patient desired to proceed with definitive hysterectomy and was taken the operating room 04/05/2018 (~ 3 weeks prior to today) where a total abdominal hysterectomy was performed at [facility] in [place], [place].  Unfortunately the final  pathology on the uterus revealed an endometrial cancer with pathology as follows: Grade 1 Endometrioid Endometrial Adenocarcinoma No myometrial invasion was seen. No lymph-vascular invasion was seen. No cervical stromal or serosal involvement.  Her ovaries and tubes remain in-situ.   She is therefore at least a Stage IA, Grade 1 EM Uterine CA. She postponed removal of her adnexa until after her daughter's wedding.  On 08/01/18 I removed the adnexa and they were both benign.  OK with Dermabond  Measurement of disease:  . Will be clinical exam   Radiology: . 02/23/2018-TA/TVUS-[facility] rocking him-uterus 9.6 x 6.1 x 7 with a 20 mm endometrial stripe normal adnexa bilaterally with a left ovary noted to have 1 cm cyst likely dominant follicle. . 05/24/2018 CXR - NED . 06/13/18 - TVUS - Uterus Surgically absent Right ovary Measurements: 2.6 x 1.5 x 2.0 cm. Normal appearance/no adnexal mass. Left ovary Measurements: 3.0 x 1.9 x 2.4 cm. Within the left adnexa there is a 4.0 x 3.7 x 1.1 cm tubular shaped cystic structure Other findings None IMPRESSION: Tubular shaped cystic structure measuring up to 4 cm within the left adnexa which is nonspecific in etiology and may represent postoperative seroma or potentially hydrosalpinx. Recommend follow-up pelvic ultrasound in 2-3 months to assess for interval change. Electronically Signed   By: Lovey Newcomer M.D.    EPIC Oncologic History:   No history exists.    Current Meds:  Outpatient Encounter Medications as of 08/22/2018  Medication Sig  . OVER THE COUNTER MEDICATION Take 6 drops by mouth daily. Iodine otc liquid supplement  . [DISCONTINUED] Cats Claw,  Uncaria tomentosa, (CATS CLAW PO) Take 1 tablet by mouth 2 (two) times daily.  . [DISCONTINUED] oxyCODONE-acetaminophen (PERCOCET/ROXICET) 5-325 MG tablet Take 1-2 tablets by mouth every 4 (four) hours as needed for severe pain. (Patient not taking: Reported on 08/22/2018)   No facility-administered encounter  medications on file as of 08/22/2018.     Allergy:  Allergies  Allergen Reactions  . Tape Rash    Bandaids  She has had Dermabond application without reaction  Social Hx:   Tobacco use: none EtOH use: none Illicit Drug use: none IV Drug use: not asked  Past Surgical Hx:  Past Surgical History:  Procedure Laterality Date  . ABDOMINAL HYSTERECTOMY  03/2018  . APPENDECTOMY     6 months pregnant  . Laparascopic bil oophorectomy     Dr. Gerarda Fraction 08-01-18  . ROBOTIC ASSISTED BILATERAL SALPINGO OOPHERECTOMY Bilateral 08/01/2018   Procedure: XI ROBOTIC ASSISTED BILATERAL SALPINGO OOPHORECTOMY;  Surgeon: Isabel Caprice, MD;  Location: [facility];  Service: Gynecology;  Laterality: Bilateral;  . WISDOM TOOTH EXTRACTION      Past Medical Hx:  Past Medical History:  Diagnosis Date  . Anxiety   . Cancer [facility])    Uterine cancer  . Headache    migraines rare  . History of kidney stones    1999  . Pre-diabetes   . Uterine polyp     Past Gynecological History:   GYNECOLOGIC HISTORY:  No LMP recorded. Patient has had a hysterectomy. Menarche: 57 years old P 4 LMP 02/2018 Contraceptive OCPs less than 10 years HRT none  Last Pap March 2019  Family Hx:  Family History  Problem Relation Age of Onset  . Thyroid disease Mother 73       treated with radiation  . Breast cancer Maternal Grandmother 60       with metastases to the brain    Review of Systems:  Review of Systems  Constitutional: Negative.   HENT:  Negative.   Eyes: Negative.   Respiratory: Negative.   Cardiovascular: Negative.   Gastrointestinal: Negative.   Endocrine: Negative.   Genitourinary: Negative.    Musculoskeletal: Negative.   Skin: Negative.   Neurological: Negative.   Hematological: Negative.   Psychiatric/Behavioral: Negative.     Vitals:  Blood pressure (!) 141/90, pulse 70, temperature 98.3 F (36.8 C), temperature source Oral, resp. rate 20, height '5\' 5"'$  (1.651 m), weight 224 lb 14.4 oz (102  kg), SpO2 100 %. Body mass index is 37.43 kg/m.   Physical Exam: ECOG PERFORMANCE STATUS: 0 - Asymptomatic   General :  Overweight, well developed, 57 y.o., female in no apparent distress HEENT:  Normocephalic/atraumatic, symmetric, EOMI, eyelids normal Neck:   No visible masses.  Respiratory:  Respirations unlabored, no use of accessory muscles CV:   Deferred Breast:  Deferred Musculoskeletal: Normal muscle strength. Abdomen:  LSC sites are CDI. No visible masses or protrusion Extremities:  No visible edema or deformities Skin:   Normal inspection Neuro/Psych:  No focal motor deficit, no abnormal mental status. Normal gait. Normal affect. Alert and oriented to person, place, and time    Assessment/Plan: 1. Pending items  ? MMR on outside hysterectomy specimen to see if risk of [place] 2. Recovering well 3. Activity restrictions reinforced. 4. RTC mid-October for first surveillance exam  Isabel Caprice, MD  08/22/2018, 11:54 AM  Cc: Gari Crown, MD (Referring OB/GYN)

## 2018-08-22 ENCOUNTER — Encounter: Payer: Self-pay | Admitting: Obstetrics

## 2018-08-22 ENCOUNTER — Inpatient Hospital Stay: Payer: BLUE CROSS/BLUE SHIELD | Attending: Obstetrics | Admitting: Obstetrics

## 2018-08-22 VITALS — BP 141/90 | HR 70 | Temp 98.3°F | Resp 20 | Ht 65.0 in | Wt 224.9 lb

## 2018-08-22 DIAGNOSIS — Z90722 Acquired absence of ovaries, bilateral: Secondary | ICD-10-CM

## 2018-08-22 DIAGNOSIS — Z90710 Acquired absence of both cervix and uterus: Secondary | ICD-10-CM

## 2018-08-22 DIAGNOSIS — C541 Malignant neoplasm of endometrium: Secondary | ICD-10-CM

## 2018-08-22 NOTE — Patient Instructions (Addendum)
1. Return mid-October for a pelvic 2. Continued activity restrictions as discussed

## 2018-09-27 ENCOUNTER — Encounter: Payer: Self-pay | Admitting: Obstetrics

## 2018-09-27 ENCOUNTER — Inpatient Hospital Stay: Payer: BLUE CROSS/BLUE SHIELD | Attending: Obstetrics | Admitting: Obstetrics

## 2018-09-27 VITALS — BP 142/75 | HR 77 | Temp 97.6°F | Resp 18 | Ht 65.0 in | Wt 227.0 lb

## 2018-09-27 DIAGNOSIS — Z90710 Acquired absence of both cervix and uterus: Secondary | ICD-10-CM | POA: Diagnosis not present

## 2018-09-27 DIAGNOSIS — Z90722 Acquired absence of ovaries, bilateral: Secondary | ICD-10-CM | POA: Diagnosis not present

## 2018-09-27 DIAGNOSIS — C541 Malignant neoplasm of endometrium: Secondary | ICD-10-CM | POA: Diagnosis not present

## 2018-09-27 NOTE — Progress Notes (Signed)
[place] at [facility]   Progress Note : GYN-ONC Established Patient Surveillance  Consult was originally requested by Dr. Gari Crown  CC:  Chief Complaint  Patient presents with  . Endometrial adenocarcinoma [facility])   Oncologic Summary: 1. At least Stage IA, Grade 1 endometrioid EM CA  04/05/18 TAH for CAH found on final to be CA  08/01/18 BSO for completion surgery 2. MMR on OSH hysterectomy normal   HPI: Ms. Eileen Rodriguez  is a very nice 57 y.o. P4  . Interval History Patient returns for a surveillance visit.  Recall early postop she admitted to lifting more than she should have. (Toddler in her care). She did have episode of nausea/diarrhea and fever about a week postop, no emesis.  She is noting some pulling sensations at her incision sites. Intermittent and they do not last long.  . Oncologic Course The patient had gone almost 1 year with no menses and then in August 2018 she had 2 months of bleeding again.  This was mentioned to her primary care provider and an ultrasound was ordered.  This revealed an endometrial thickness of 20 mm and a 10 mm polypoid lesion.  She was then referred on to Dr. Evie Lacks and on 03/14/2018 and endometrial biopsy revealed complex hyperplasia with atypia.  The patient desired to proceed with definitive hysterectomy and was taken the operating room 04/05/2018 (~ 3 weeks prior to today) where a total abdominal hysterectomy was performed at [facility] in [place], [place].  Unfortunately the final pathology on the uterus revealed an endometrial cancer with pathology as follows: Grade 1 Endometrioid Endometrial Adenocarcinoma No myometrial invasion was seen. No lymph-vascular invasion was seen. No cervical stromal or serosal involvement.  Her ovaries and tubes remain in-situ.   She is therefore at least a Stage IA, Grade 1 EM Uterine CA. She postponed removal of her adnexa until after her daughter's  wedding.  On 08/01/18 I removed the adnexa and they were both benign.  She then went with (Dr. Gerarda Fraction) for a laparoscopic (robo-asssist) BSO 08/01/18 to complete her surgery for endometrial adenoca. She did have adhesive disease increasing the difficulty of the surgery. Thankfully final pathology showed benign bilateral adnexa 1. Ovary and fallopian tube, left - BENIGN UNREMARKABLE OVARY AND FALLOPIAN TUBE. - NEGATIVE FOR MALIGNANCY. 2. Ovary and fallopian tube, right - BENIGN UNREMARKABLE OVARY AND FALLOPIAN TUBE. - NEGATIVE FOR MALIGNANCY.  OK with Dermabond  Measurement of disease:  . Clinical exam and annual Pap   Radiology: . 02/23/2018-TA/TVUS-[facility] rocking him-uterus 9.6 x 6.1 x 7 with a 20 mm endometrial stripe normal adnexa bilaterally with a left ovary noted to have 1 cm cyst likely dominant follicle. . 05/24/2018 CXR - NED . 06/13/18 - TVUS - Uterus Surgically absent Right ovary Measurements: 2.6 x 1.5 x 2.0 cm. Normal appearance/no adnexal mass. Left ovary Measurements: 3.0 x 1.9 x 2.4 cm. Within the left adnexa there is a 4.0 x 3.7 x 1.1 cm tubular shaped cystic structure Other findings None IMPRESSION: Tubular shaped cystic structure measuring up to 4 cm within the left adnexa which is nonspecific in etiology and may represent postoperative seroma or potentially hydrosalpinx. Recommend follow-up pelvic ultrasound in 2-3 months to assess for interval change.    EPIC Oncologic History:   No history exists.    Current Meds:  Outpatient Encounter Medications as of 09/27/2018  Medication Sig  . OVER THE COUNTER MEDICATION Take 6 drops by mouth daily. Iodine  otc liquid supplement   No facility-administered encounter medications on file as of 09/27/2018.     Allergy:  Allergies  Allergen Reactions  . Tape Rash    Bandaids  She has had Dermabond application without reaction  Social Hx:   Tobacco use: none EtOH use: none Illicit Drug use: none IV Drug use: not asked  Past  Surgical Hx:  Past Surgical History:  Procedure Laterality Date  . ABDOMINAL HYSTERECTOMY  03/2018  . APPENDECTOMY     6 months pregnant  . Laparascopic bil oophorectomy     Dr. Gerarda Fraction 08-01-18  . ROBOTIC ASSISTED BILATERAL SALPINGO OOPHERECTOMY Bilateral 08/01/2018   Procedure: XI ROBOTIC ASSISTED BILATERAL SALPINGO OOPHORECTOMY;  Surgeon: Isabel Caprice, MD;  Location: [facility];  Service: Gynecology;  Laterality: Bilateral;  . WISDOM TOOTH EXTRACTION      Past Medical Hx:  Past Medical History:  Diagnosis Date  . Anxiety   . Cancer [facility])    Uterine cancer  . Headache    migraines rare  . History of kidney stones    1999  . Pre-diabetes   . Uterine polyp     Past Gynecological History:   GYNECOLOGIC HISTORY:  No LMP recorded. Patient has had a hysterectomy. Menarche: 57 years old P 4 LMP 02/2018 Contraceptive OCPs less than 10 years HRT none  Last Pap March 2019  Family Hx:  Family History  Problem Relation Age of Onset  . Thyroid disease Mother 76       treated with radiation  . Breast cancer Maternal Grandmother 60       with metastases to the brain    Review of Systems:  Review of Systems  Constitutional: Negative.   HENT:  Negative.   Eyes: Negative.   Respiratory: Negative.   Cardiovascular: Negative.   Gastrointestinal: Positive for abdominal pain.  Endocrine: Negative.   Genitourinary: Negative.    Musculoskeletal: Negative.   Skin: Negative.   Neurological: Negative.   Hematological: Negative.   Psychiatric/Behavioral: Negative.     Vitals:  Blood pressure (!) 142/75, pulse 77, temperature 97.6 F (36.4 C), temperature source Oral, resp. rate 18, height '5\' 5"'$  (1.651 m), weight 227 lb (103 kg), SpO2 99 %. Body mass index is 37.77 kg/m.   Physical Exam: ECOG PERFORMANCE STATUS: 0 - Asymptomatic  General :  Well developed, 57 y.o., female in no apparent distress HEENT:  Normocephalic/atraumatic, symmetric, EOMI, eyelids normal Neck:    Supple, no masses.  Lymphatics:  No cervical/ submandibular/ supraclavicular/ infraclavicular/ inguinal adenopathy Respiratory:  Respirations unlabored, no use of accessory muscles CV:   Deferred Breast:  Deferred Musculoskeletal: No CVA tenderness, normal muscle strength. Abdomen:  Trocar sites are soft. Abdomen is soft, non-tender and nondistended. No evidence of hernia. No masses. Extremities:  No lymphedema, no erythema, non-tender. Skin:   Normal inspection Neuro/Psych:  No focal motor deficit, no abnormal mental status. Normal gait. Normal affect. Alert and oriented to person, place, and time  Genito Urinary: Vulva: Normal external female genitalia.  Bladder/urethra: Urethral meatus normal in size and location. No lesions or   masses, well supported bladder Speculum exam: Vagina: No lesion, no discharge, no bleeding. Bimanual exam: Cervix/Uterus/Adnexa: Surgically absent  Adnexa: No masses. Rectovaginal:  Good tone, no masses, no cul de sac nodularity, no parametrial involvement or nodularity.   Assessment/Plan: 1. She is clinically free of disease today. 2. Surveillance plan ? RTC Q6 months for pelvic ? Annual Pap (to start April 2020) ? Keep annual visit  with Dr. Evie Lacks as once she is out 5 years we'll dispo her   Face to face time with patient was 15 minutes. Over 50% of this time was spent on counseling and coordination of care.   Isabel Caprice, MD  09/27/2018, 4:29 PM  Cc: Gari Crown, MD (Referring OB/GYN)

## 2018-09-27 NOTE — Patient Instructions (Addendum)
1. Return in 6 months for a followup / pelvic.  Please call our office at [phone] in November or after to schedule your appointment for six months.  Please call for any new symptoms such as bleeding or pain.

## 2018-11-23 ENCOUNTER — Encounter: Payer: Self-pay | Admitting: Gastroenterology

## 2018-12-18 ENCOUNTER — Ambulatory Visit (AMBULATORY_SURGERY_CENTER): Payer: Self-pay | Admitting: *Deleted

## 2018-12-18 VITALS — Ht 65.0 in | Wt 230.0 lb

## 2018-12-18 DIAGNOSIS — Z1211 Encounter for screening for malignant neoplasm of colon: Secondary | ICD-10-CM

## 2018-12-18 MED ORDER — NA SULFATE-K SULFATE-MG SULF 17.5-3.13-1.6 GM/177ML PO SOLN: 1.0000 | Freq: Once | ORAL | 0 refills | Status: AC

## 2018-12-18 NOTE — Progress Notes (Signed)
No egg or soy allergy known to patient   issues with past sedation with any surgeries  or procedures of PONV , no intubation problems  No diet pills per patient No home 02 use per patient  No blood thinners per patient  Pt states  issues with constipation-  Alternating with diarrhea- no meds for constipation - not daily  No A fib or A flutter  EMMI video sent to pt's e mail - pt declined  Pt states she has external hems with occ blood noticed

## 2018-12-22 ENCOUNTER — Encounter: Payer: Self-pay | Admitting: Gastroenterology

## 2019-01-01 ENCOUNTER — Encounter: Payer: Self-pay | Admitting: Gastroenterology

## 2019-01-01 ENCOUNTER — Ambulatory Visit (AMBULATORY_SURGERY_CENTER): Payer: BLUE CROSS/BLUE SHIELD | Admitting: Gastroenterology

## 2019-01-01 VITALS — BP 138/75 | HR 65 | Temp 99.6°F | Resp 19 | Ht 65.0 in | Wt 227.0 lb

## 2019-01-01 DIAGNOSIS — Z1211 Encounter for screening for malignant neoplasm of colon: Secondary | ICD-10-CM | POA: Diagnosis not present

## 2019-01-01 DIAGNOSIS — D128 Benign neoplasm of rectum: Secondary | ICD-10-CM

## 2019-01-01 DIAGNOSIS — D122 Benign neoplasm of ascending colon: Secondary | ICD-10-CM | POA: Diagnosis not present

## 2019-01-01 MED ORDER — SODIUM CHLORIDE 0.9 % IV SOLN: 500.0000 mL | Freq: Once | INTRAVENOUS | Status: DC

## 2019-01-01 NOTE — Progress Notes (Signed)
Called to room to assist during endoscopic procedure.  Patient ID and intended procedure confirmed with present staff. Received instructions for my participation in the procedure from the performing physician.  

## 2019-01-01 NOTE — Progress Notes (Signed)
Pt's states no medical or surgical changes since previsit or office visit. 

## 2019-01-01 NOTE — Progress Notes (Signed)
A/ox3, pleased with MAC, report to RN 

## 2019-01-01 NOTE — Patient Instructions (Addendum)
YOU HAD AN ENDOSCOPIC PROCEDURE TODAY AT THE [facility]:   Refer to the procedure report that was given to you for any specific questions about what was found during the examination.  If the procedure report does not answer your questions, please call your gastroenterologist to clarify.  If you requested that your care partner not be given the details of your procedure findings, then the procedure report has been included in a sealed envelope for you to review at your convenience later.  YOU SHOULD EXPECT: Some feelings of bloating in the abdomen. Passage of more gas than usual.  Walking can help get rid of the air that was put into your GI tract during the procedure and reduce the bloating. If you had a lower endoscopy (such as a colonoscopy or flexible sigmoidoscopy) you may notice spotting of blood in your stool or on the toilet paper. If you underwent a bowel prep for your procedure, you may not have a normal bowel movement for a few days.  Please Note:  You might notice some irritation and congestion in your nose or some drainage.  This is from the oxygen used during your procedure.  There is no need for concern and it should clear up in a day or so.  SYMPTOMS TO REPORT IMMEDIATELY:   Following lower endoscopy (colonoscopy or flexible sigmoidoscopy):  Excessive amounts of blood in the stool  Significant tenderness or worsening of abdominal pains  Swelling of the abdomen that is new, acute  Fever of 100F or higher  For urgent or emergent issues, a gastroenterologist can be reached at any hour by calling [phone].   DIET:  We do recommend a small meal at first, but then you may proceed to your regular diet.  Drink plenty of fluids but you should avoid alcoholic beverages for 24 hours.  ACTIVITY:  You should plan to take it easy for the rest of today and you should NOT DRIVE or use heavy machinery until tomorrow (because of the sedation medicines used during the test).     FOLLOW UP: Our staff will call the number listed on your records the next business day following your procedure to check on you and address any questions or concerns that you may have regarding the information given to you following your procedure. If we do not reach you, we will leave a message.  However, if you are feeling well and you are not experiencing any problems, there is no need to return our call.  We will assume that you have returned to your regular daily activities without incident.  If any biopsies were taken you will be contacted by phone or by letter within the next 1-3 weeks.  Please call us at [phone] if you have not heard about the biopsies in 3 weeks.    SIGNATURES/CONFIDENTIALITY: You and/or your care partner have signed paperwork which will be entered into your electronic medical record.  These signatures attest to the fact that that the information above on your After Visit Summary has been reviewed and is understood.  Full responsibility of the confidentiality of this discharge information lies with you and/or your care-partner.    Handouts were given to your care partner on polyps, diverticulosis, hemorrhoids and a high fiber diet with liberal fluid intake. You may resume your current medications today. Await biopsy results. Please call if any questions or concerns.   

## 2019-01-01 NOTE — Op Note (Addendum)
[name] Procedure Date: 01/01/2019 11:14 AM MRN: [id number] Endoscopist: Ladene Artist , MD Age: 58 Referring MD:  Date of Birth: November 25, 1961 Gender: Female Account #: [id number] Procedure:                Colonoscopy Indications:              Screening for colorectal malignant neoplasm Medicines:                Monitored Anesthesia Care Procedure:                Pre-Anesthesia Assessment:                           - Prior to the procedure, a History and Physical                            was performed, and patient medications and                            allergies were reviewed. The patient's tolerance of                            previous anesthesia was also reviewed. The risks                            and benefits of the procedure and the sedation                            options and risks were discussed with the patient.                            All questions were answered, and informed consent                            was obtained. Prior Anticoagulants: The patient has                            taken no previous anticoagulant or antiplatelet                            agents. ASA Grade Assessment: II - A patient with                            mild systemic disease. After reviewing the risks                            and benefits, the patient was deemed in                            satisfactory condition to undergo the procedure.                           After obtaining informed consent, the colonoscope  was passed under direct vision. Throughout the                            procedure, the patient's blood pressure, pulse, and                            oxygen saturations were monitored continuously. The                            Model PCF-H190DL [id number]) scope was introduced                            through the anus and advanced to the the cecum,                            identified by  appendiceal orifice and ileocecal                            valve. The ileocecal valve, appendiceal orifice,                            and rectum were photographed. The quality of the                            bowel preparation was excellent. The colonoscopy                            was performed without difficulty. The patient                            tolerated the procedure well. Scope In: 11:18:05 AM Scope Out: 11:32:58 AM Scope Withdrawal Time: 0 hours 12 minutes 24 seconds  Total Procedure Duration: 0 hours 14 minutes 53 seconds  Findings:                 The perianal and digital rectal examinations large                            external hemorrhoids / hemorrhoidal tag.                           A 7 mm polyp was found in the rectum. The polyp was                            semi-pedunculated. The polyp was removed with a                            cold snare. Resection and retrieval were complete.                           Three sessile polyps were found in the ascending                            colon. The polyps were 2 to 5  mm in size. These                            polyps were removed with a cold snare. Resection                            and retrieval were complete.                           Multiple small-mouthed diverticula were found in                            the left colon.                           The exam was otherwise without abnormality on                            direct and retroflexion views. Complications:            No immediate complications. Estimated blood loss:                            None. Estimated Blood Loss:     Estimated blood loss: none. Impression:               - One 7 mm polyp in the rectum, removed with a cold                            snare. Resected and retrieved.                           - Three 2 to 5 mm polyps in the ascending colon,                            removed with a cold snare. Resected and retrieved.                            - Diverticulosis in the left colon.                           - Large external hemorroids / hemorrhoidal tags                           - The examination was otherwise normal on direct                            and retroflexion views. Recommendation:           - Repeat colonoscopy in 3 - 5 years for                            surveillance pending pathology review.                           - Patient has a contact number available for  emergencies. The signs and symptoms of potential                            delayed complications were discussed with the                            patient. Return to normal activities tomorrow.                            Written discharge instructions were provided to the                            patient.                           - High fiber diet.                           - Continue present medications.                           - Await pathology results. Ladene Artist, MD 01/01/2019 11:38:18 AM This report has been signed electronically.

## 2019-01-02 ENCOUNTER — Telehealth: Payer: Self-pay | Admitting: *Deleted

## 2019-01-02 ENCOUNTER — Telehealth: Payer: Self-pay

## 2019-01-02 NOTE — Telephone Encounter (Signed)
Left message on f/u call 

## 2019-01-02 NOTE — Telephone Encounter (Signed)
  Follow up Call-  Call back number 01/01/2019  Post procedure Call Back phone  # [phone]  Permission to leave phone message Yes     Patient questions:  Do you have a fever, pain , or abdominal swelling? No. Pain Score  0 *  Have you tolerated food without any problems? Yes.    Have you been able to return to your normal activities? Yes.    Do you have any questions about your discharge instructions: Diet   No. Medications  No. Follow up visit  No.  Do you have questions or concerns about your Care? No.  Actions: * If pain score is 4 or above: No action needed, pain <4.

## 2019-01-09 ENCOUNTER — Encounter: Payer: Self-pay | Admitting: Gastroenterology

## 2019-01-18 DIAGNOSIS — E8881 Metabolic syndrome: Secondary | ICD-10-CM | POA: Diagnosis not present

## 2019-01-18 DIAGNOSIS — R7301 Impaired fasting glucose: Secondary | ICD-10-CM | POA: Diagnosis not present

## 2019-01-18 DIAGNOSIS — I10 Essential (primary) hypertension: Secondary | ICD-10-CM | POA: Diagnosis not present

## 2019-01-18 DIAGNOSIS — E782 Mixed hyperlipidemia: Secondary | ICD-10-CM | POA: Diagnosis not present

## 2019-01-22 DIAGNOSIS — E8881 Metabolic syndrome: Secondary | ICD-10-CM | POA: Diagnosis not present

## 2019-01-22 DIAGNOSIS — I10 Essential (primary) hypertension: Secondary | ICD-10-CM | POA: Diagnosis not present

## 2019-01-22 DIAGNOSIS — E782 Mixed hyperlipidemia: Secondary | ICD-10-CM | POA: Diagnosis not present

## 2019-01-22 DIAGNOSIS — R7301 Impaired fasting glucose: Secondary | ICD-10-CM | POA: Diagnosis not present

## 2019-02-01 DIAGNOSIS — H5213 Myopia, bilateral: Secondary | ICD-10-CM | POA: Diagnosis not present

## 2019-07-11 IMAGING — DX DG CHEST 2V
2 series · 2 of 2 positions shown · non-contrast
Comparison: None.

CLINICAL DATA: History of endometrial cancer.

EXAM:
CHEST - 2 VIEW

[chest pa]
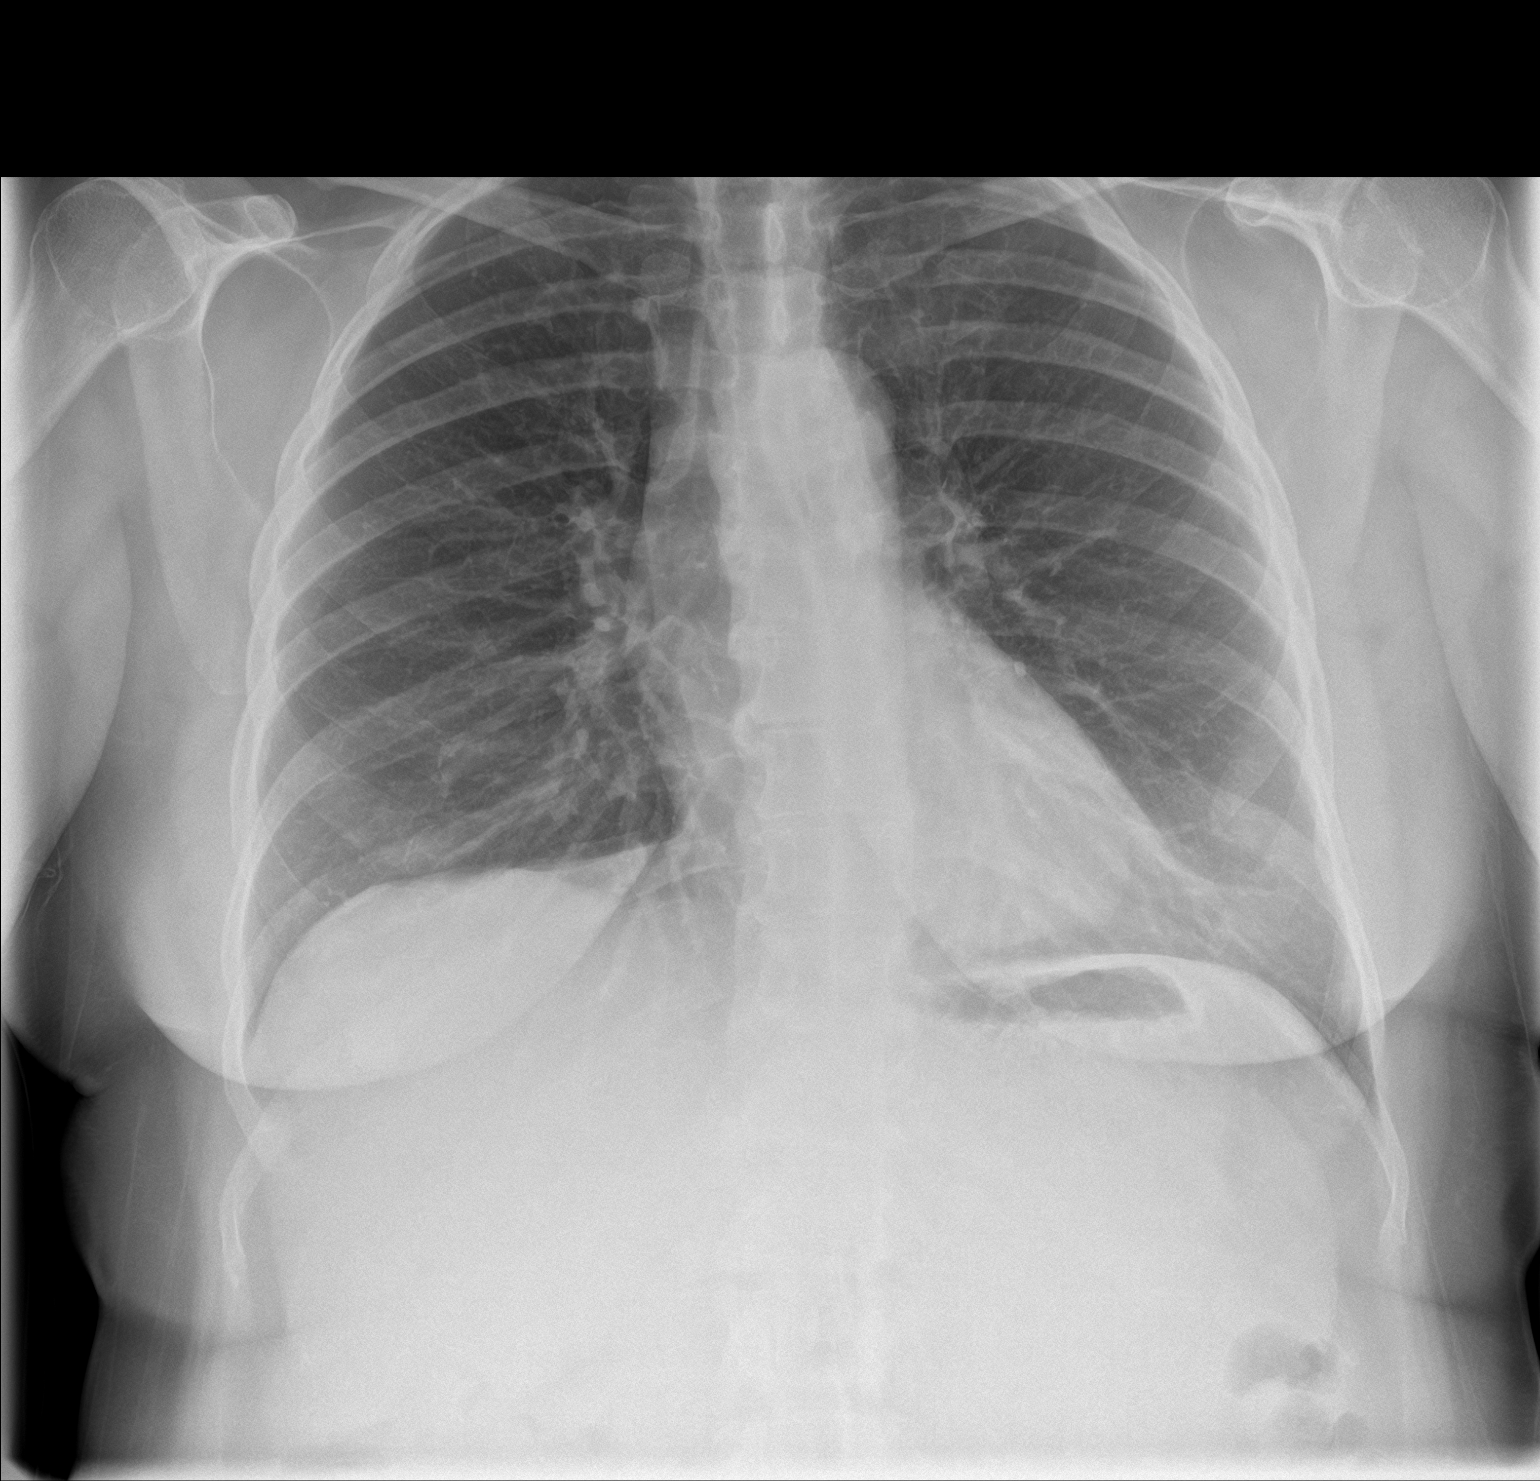

[chest lat]
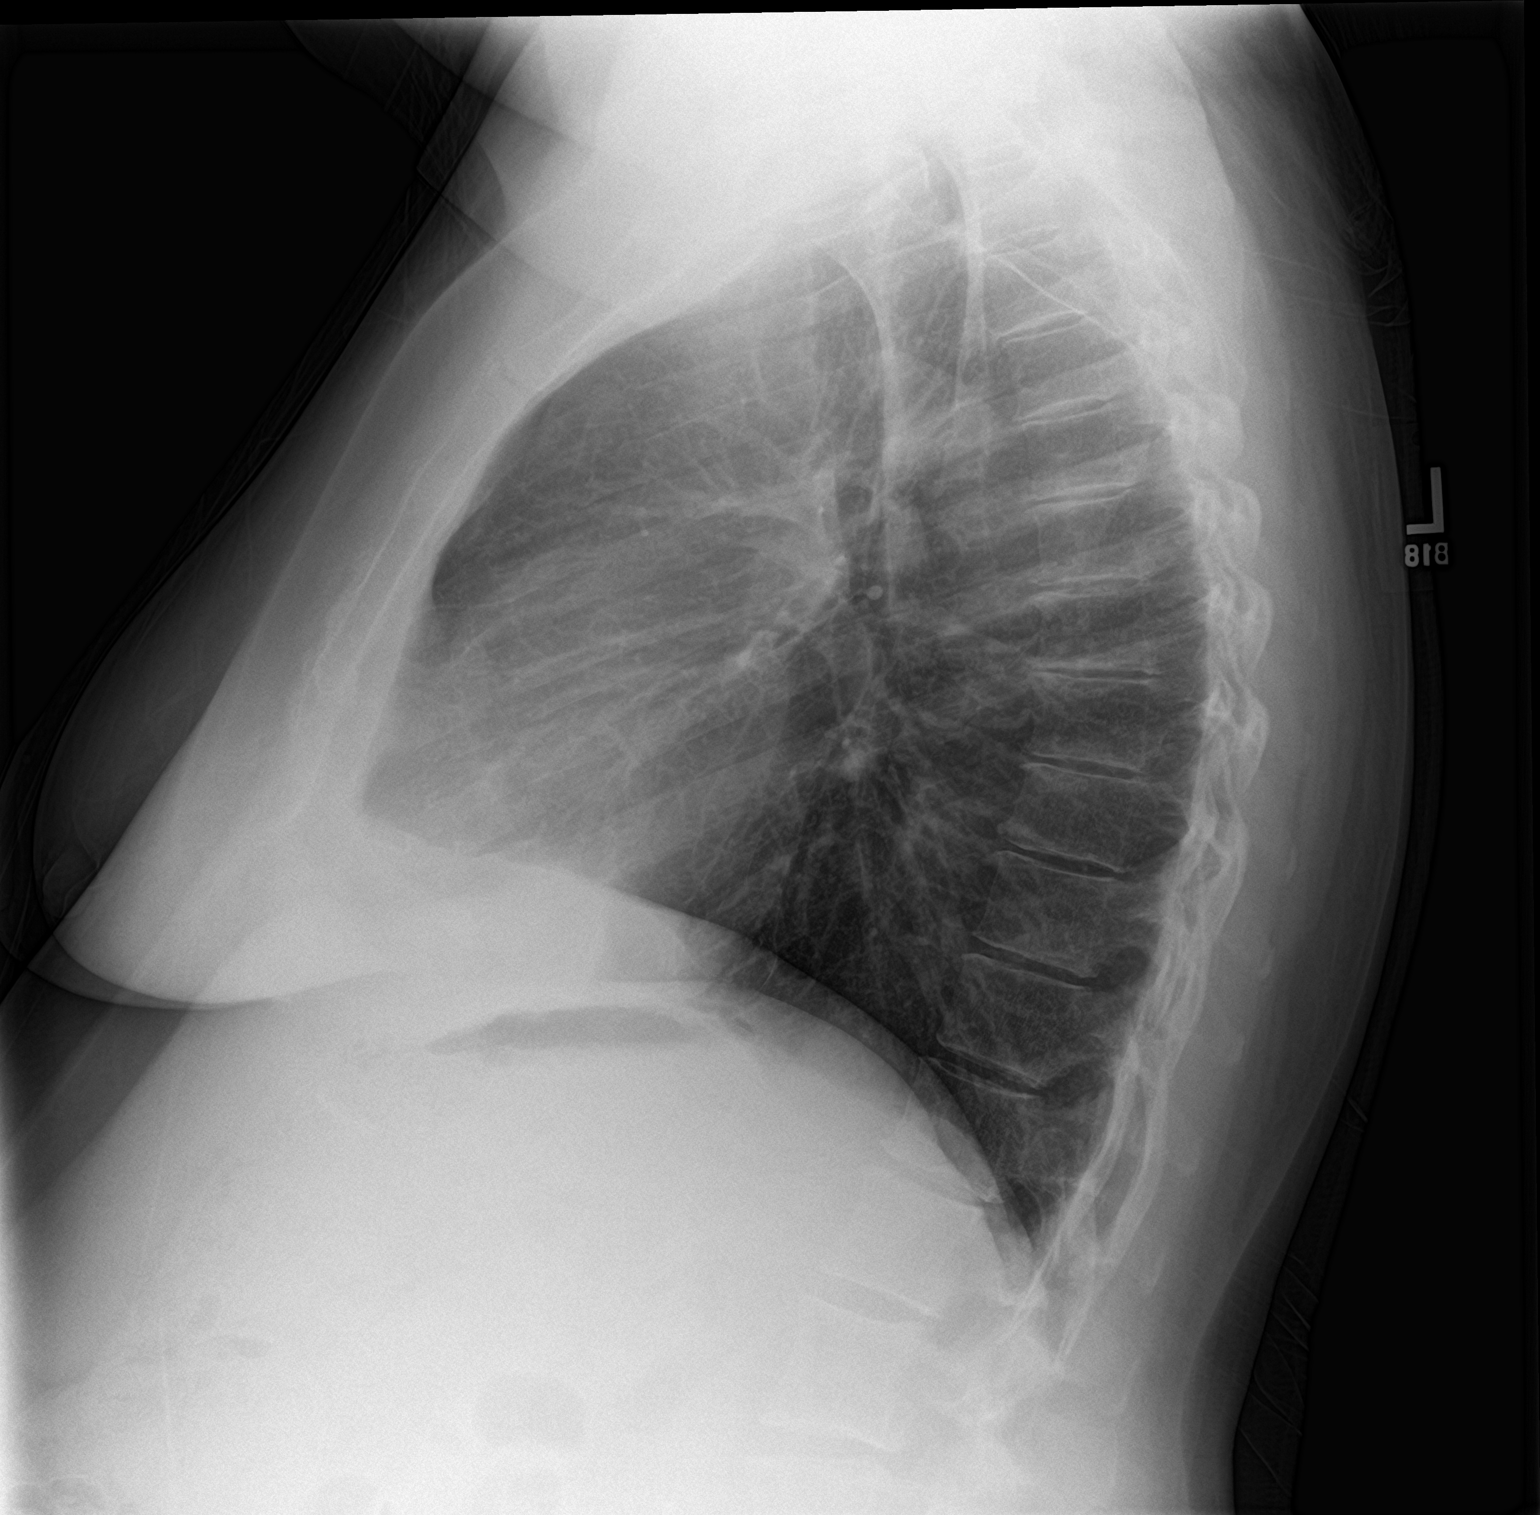

[2 of 2 positions shown; findings below may reference images not displayed]

FINDINGS: The heart size and mediastinal contours are within normal limits.
Both lungs are clear. The visualized skeletal structures are
unremarkable.
IMPRESSION: No active cardiopulmonary disease.

## 2019-09-27 DIAGNOSIS — E782 Mixed hyperlipidemia: Secondary | ICD-10-CM | POA: Diagnosis not present

## 2019-09-27 DIAGNOSIS — R7301 Impaired fasting glucose: Secondary | ICD-10-CM | POA: Diagnosis not present

## 2019-09-27 DIAGNOSIS — I10 Essential (primary) hypertension: Secondary | ICD-10-CM | POA: Diagnosis not present

## 2019-09-27 DIAGNOSIS — E039 Hypothyroidism, unspecified: Secondary | ICD-10-CM | POA: Diagnosis not present

## 2019-09-27 DIAGNOSIS — E8881 Metabolic syndrome: Secondary | ICD-10-CM | POA: Diagnosis not present

## 2019-10-03 DIAGNOSIS — E668 Other obesity: Secondary | ICD-10-CM | POA: Diagnosis not present

## 2019-10-03 DIAGNOSIS — I10 Essential (primary) hypertension: Secondary | ICD-10-CM | POA: Diagnosis not present

## 2019-10-03 DIAGNOSIS — E782 Mixed hyperlipidemia: Secondary | ICD-10-CM | POA: Diagnosis not present

## 2019-10-03 DIAGNOSIS — E8881 Metabolic syndrome: Secondary | ICD-10-CM | POA: Diagnosis not present

## 2022-01-13 ENCOUNTER — Encounter: Payer: Self-pay | Admitting: Gastroenterology

## 2022-02-11 DIAGNOSIS — R7301 Impaired fasting glucose: Secondary | ICD-10-CM | POA: Diagnosis not present

## 2022-02-11 DIAGNOSIS — E782 Mixed hyperlipidemia: Secondary | ICD-10-CM | POA: Diagnosis not present

## 2022-02-11 DIAGNOSIS — E8881 Metabolic syndrome: Secondary | ICD-10-CM | POA: Diagnosis not present

## 2022-02-11 DIAGNOSIS — E7849 Other hyperlipidemia: Secondary | ICD-10-CM | POA: Diagnosis not present

## 2022-02-11 DIAGNOSIS — Z1329 Encounter for screening for other suspected endocrine disorder: Secondary | ICD-10-CM | POA: Diagnosis not present

## 2022-02-11 DIAGNOSIS — I10 Essential (primary) hypertension: Secondary | ICD-10-CM | POA: Diagnosis not present

## 2022-02-16 DIAGNOSIS — Z6835 Body mass index (BMI) 35.0-35.9, adult: Secondary | ICD-10-CM | POA: Diagnosis not present

## 2022-02-16 DIAGNOSIS — R7301 Impaired fasting glucose: Secondary | ICD-10-CM | POA: Diagnosis not present

## 2022-02-16 DIAGNOSIS — E668 Other obesity: Secondary | ICD-10-CM | POA: Diagnosis not present

## 2022-02-16 DIAGNOSIS — E7849 Other hyperlipidemia: Secondary | ICD-10-CM | POA: Diagnosis not present

## 2022-02-16 DIAGNOSIS — E8881 Metabolic syndrome: Secondary | ICD-10-CM | POA: Diagnosis not present

## 2022-02-16 DIAGNOSIS — I10 Essential (primary) hypertension: Secondary | ICD-10-CM | POA: Diagnosis not present

## 2023-07-16 DIAGNOSIS — Z20828 Contact with and (suspected) exposure to other viral communicable diseases: Secondary | ICD-10-CM | POA: Diagnosis not present

## 2023-07-16 DIAGNOSIS — R059 Cough, unspecified: Secondary | ICD-10-CM | POA: Diagnosis not present
